# Patient Record
Sex: Female | Born: 1975 | Race: White | Hispanic: No | Marital: Married | Smoking: Never smoker
Health system: Southern US, Community
[De-identification: ages and names within clinical notes are randomized; demographics above are authoritative.]

## PROBLEM LIST (undated history)

## (undated) DIAGNOSIS — F329 Major depressive disorder, single episode, unspecified: Secondary | ICD-10-CM

## (undated) DIAGNOSIS — F32A Depression, unspecified: Secondary | ICD-10-CM

## (undated) DIAGNOSIS — F419 Anxiety disorder, unspecified: Secondary | ICD-10-CM

## (undated) DIAGNOSIS — R0681 Apnea, not elsewhere classified: Secondary | ICD-10-CM

## (undated) HISTORY — DX: Depression, unspecified: F32.A

## (undated) HISTORY — DX: Major depressive disorder, single episode, unspecified: F32.9

## (undated) HISTORY — PX: WISDOM TOOTH EXTRACTION: SHX21

## (undated) HISTORY — PX: CHOLECYSTECTOMY: SHX55

## (undated) HISTORY — DX: Anxiety disorder, unspecified: F41.9

## (undated) HISTORY — DX: Apnea, not elsewhere classified: R06.81

---

## 2006-11-01 ENCOUNTER — Emergency Department: Payer: Self-pay | Admitting: Emergency Medicine

## 2006-11-01 ENCOUNTER — Other Ambulatory Visit: Payer: Self-pay

## 2012-03-22 ENCOUNTER — Ambulatory Visit: Payer: Self-pay | Admitting: Obstetrics and Gynecology

## 2013-01-17 ENCOUNTER — Ambulatory Visit: Payer: Self-pay | Admitting: Otolaryngology

## 2013-01-23 ENCOUNTER — Ambulatory Visit: Payer: Self-pay | Admitting: Otolaryngology

## 2013-01-29 ENCOUNTER — Ambulatory Visit: Payer: Self-pay | Admitting: Emergency Medicine

## 2013-10-02 DIAGNOSIS — E669 Obesity, unspecified: Secondary | ICD-10-CM | POA: Insufficient documentation

## 2016-10-03 DIAGNOSIS — F325 Major depressive disorder, single episode, in full remission: Secondary | ICD-10-CM | POA: Insufficient documentation

## 2018-02-13 DIAGNOSIS — E559 Vitamin D deficiency, unspecified: Secondary | ICD-10-CM | POA: Insufficient documentation

## 2018-07-25 ENCOUNTER — Ambulatory Visit: Payer: BC Managed Care – PPO | Attending: Otolaryngology

## 2018-07-25 DIAGNOSIS — G4733 Obstructive sleep apnea (adult) (pediatric): Secondary | ICD-10-CM | POA: Insufficient documentation

## 2019-01-01 ENCOUNTER — Other Ambulatory Visit: Payer: Self-pay

## 2019-01-01 ENCOUNTER — Encounter: Payer: Self-pay | Admitting: Obstetrics and Gynecology

## 2019-01-01 ENCOUNTER — Other Ambulatory Visit (HOSPITAL_COMMUNITY)
Admission: RE | Admit: 2019-01-01 | Discharge: 2019-01-01 | Disposition: A | Discharge status: Home / Self Care | Payer: BC Managed Care – PPO | Source: Ambulatory Visit | Attending: Obstetrics and Gynecology | Admitting: Obstetrics and Gynecology

## 2019-01-01 ENCOUNTER — Ambulatory Visit (INDEPENDENT_AMBULATORY_CARE_PROVIDER_SITE_OTHER): Payer: BC Managed Care – PPO | Admitting: Obstetrics and Gynecology

## 2019-01-01 VITALS — BP 140/80 | Ht 69.0 in | Wt 214.0 lb

## 2019-01-01 DIAGNOSIS — Z124 Encounter for screening for malignant neoplasm of cervix: Secondary | ICD-10-CM | POA: Diagnosis present

## 2019-01-01 DIAGNOSIS — Z1231 Encounter for screening mammogram for malignant neoplasm of breast: Secondary | ICD-10-CM

## 2019-01-01 DIAGNOSIS — Z01419 Encounter for gynecological examination (general) (routine) without abnormal findings: Secondary | ICD-10-CM

## 2019-01-01 DIAGNOSIS — Z0000 Encounter for general adult medical examination without abnormal findings: Secondary | ICD-10-CM

## 2019-01-01 DIAGNOSIS — B354 Tinea corporis: Secondary | ICD-10-CM

## 2019-01-01 MED ORDER — ITRACONAZOLE 100 MG PO CAPS: 200.0000 mg | ORAL_CAPSULE | Freq: Every day | ORAL | 1 refills | Status: AC

## 2019-01-01 NOTE — Progress Notes (Signed)
Gynecology Annual Exam  PCP: Patient, [name]  Chief Complaint:  Chief Complaint  Patient presents with  . Gynecologic Exam    Night sweats and hot flashes     History of Present Illness: Patient is a 43 y.o. [id number] presents for annual exam. The patient has no complaints today.   LMP: Patient's last menstrual period was 12/23/2018 (exact date). Average Interval: regular, 30 days Duration of flow: 2 days Heavy Menses: no Clots: no Intermenstrual Bleeding: no Postcoital Bleeding: no Dysmenorrhea: no   The patient is sexually active. She currently uses vasectomy for contraception. She denies dyspareunia.  The patient does perform self breast exams.  There is no notable family history of breast or ovarian cancer in her family.  The patient wears seatbelts: yes.   The patient has regular exercise: no.    The patient denies current symptoms of depression.    Review of Systems: Review of Systems  Constitutional: Negative for chills, fever, malaise/fatigue and weight loss.  HENT: Negative for congestion, hearing loss and sinus pain.   Eyes: Negative for blurred vision and double vision.  Respiratory: Negative for cough, sputum production, shortness of breath and wheezing.   Cardiovascular: Negative for chest pain, palpitations, orthopnea and leg swelling.  Gastrointestinal: Negative for abdominal pain, constipation, diarrhea, nausea and vomiting.  Genitourinary: Negative for dysuria, flank pain, frequency, hematuria and urgency.  Musculoskeletal: Negative for back pain, falls and joint pain.  Skin: Negative for itching and rash.  Neurological: Negative for dizziness and headaches.  Psychiatric/Behavioral: Negative for depression, substance abuse and suicidal ideas. The patient is not nervous/anxious.     Past Medical History:  Past Medical History:  Diagnosis Date  . Anxiety   . Apnea   . Depression     Past Surgical History:  Past Surgical History:  Procedure  Laterality Date  . CHOLECYSTECTOMY    . WISDOM TOOTH EXTRACTION     X4 extracted     Gynecologic History:  Patient's last menstrual period was 12/23/2018 (exact date). Contraception: vasectomy Last Pap: Results were: unknown  Last mammogram: 2013 Results were: BI-RAD I  Obstetric History: [id number]  Family History:  Family History  Problem Relation Age of Onset  . Diabetes Father   . Diabetes Maternal Grandmother     Social History:  Social History   Socioeconomic History  . Marital status: Married    Spouse name: Not on file  . Number of children: Not on file  . Years of education: Not on file  . Highest education level: Not on file  Occupational History  . Not on file  Social Needs  . Financial resource strain: Not on file  . Food insecurity:    Worry: Not on file    Inability: Not on file  . Transportation needs:    Medical: Not on file    Non-medical: Not on file  Tobacco Use  . Smoking status: Never Smoker  . Smokeless tobacco: Never Used  Substance and Sexual Activity  . Alcohol use: Yes    Comment: occasional   . Drug use: Never  . Sexual activity: Yes    Birth control/protection: Surgical    Comment: Vasectomy   Lifestyle  . Physical activity:    Days per week: Not on file    Minutes per session: Not on file  . Stress: Not on file  Relationships  . Social connections:    Talks on phone: Not on file    Gets together: Not  on file    Attends religious service: Not on file    Active member of club or organization: Not on file    Attends meetings of clubs or organizations: Not on file    Relationship status: Not on file  . Intimate partner violence:    Fear of current or ex partner: Not on file    Emotionally abused: Not on file    Physically abused: Not on file    Forced sexual activity: Not on file  Other Topics Concern  . Not on file  Social History Narrative  . Not on file    Allergies:  No Known Allergies  Medications: Prior to  Admission medications   Medication Sig Start Date End Date Taking? Authorizing Provider  Cholecalciferol (VITAMIN D3) 125 MCG (5000 UT) TABS Take by mouth. 02/13/18  Yes [provider]  escitalopram (LEXAPRO) 20 MG tablet Take 20 mg by mouth daily. 11/14/18  Yes [provider]  fluconazole (DIFLUCAN) 100 MG tablet TAKE 1 TABLET (100 MG TOTAL) BY MOUTH ONCE DAILY FOR 14 DAYS 10/18/18  Yes [provider]  phentermine 30 MG capsule TAKE 1 CAPSULE (30 MG TOTAL) BY MOUTH EVERY MORNING BEFORE BREAKFAST 11/29/18  Yes [provider]  traZODone (DESYREL) 50 MG tablet Take by mouth. 10/18/18 10/18/19 Yes [provider]    Physical Exam Vitals: Blood pressure 140/80, height 5\' 9"  (1.753 m), weight 214 lb (97.1 kg), last menstrual period 12/23/2018.  General: NAD HEENT: normocephalic, anicteric Thyroid: no enlargement, no palpable nodules Pulmonary: No increased work of breathing, CTAB Cardiovascular: RRR, distal pulses 2+ Breast: Breast symmetrical, no tenderness, no palpable nodules or masses, no skin or nipple retraction present, no nipple discharge.  No axillary or supraclavicular lymphadenopathy. Axillary fungal skin rash noted in left axilla. Abdomen: NABS, soft, non-tender, non-distended.  Umbilicus without lesions.  No hepatomegaly, splenomegaly or masses palpable. No evidence of hernia  Genitourinary:  External: Normal external female genitalia.  Normal urethral meatus, normal Bartholin's and Skene's glands.    Vagina: Normal vaginal mucosa, no evidence of prolapse.    Cervix: Grossly normal in appearance, no bleeding  Uterus: Non-enlarged, mobile, normal contour.  No CMT  Adnexa: ovaries non-enlarged, no adnexal masses  Rectal: deferred  Lymphatic: no evidence of inguinal lymphadenopathy Extremities: no edema, erythema, or tenderness Neurologic: Grossly intact Psychiatric: mood appropriate, affect full  Female chaperone present for pelvic and  breast  portions of the physical exam    Assessment: 43 y.o. G2P1102 routine annual exam  Plan: Problem List Items Addressed This Visit    None    Visit Diagnoses    Screening for cervical cancer    -  Primary   Relevant Orders   Cytology - PAP   Breast cancer screening by mammogram       Relevant Orders   MM DIGITAL SCREENING BILATERAL   Tinea corporis       Relevant Medications   fluconazole (DIFLUCAN) 100 MG tablet   itraconazole (SPORANOX) 100 MG capsule   Healthcare maintenance          1) Mammogram - recommend yearly screening mammogram.  Mammogram Was ordered today   2) STI screening  wasoffered and declined  3) ASCCP guidelines and rational discussed.  Patient opts for every 3 years screening interval  4) Contraception - the patient is currently using  vasectomy.  She is happy with her current form of contraception and plans to continue  5) Colonoscopy -- Screening recommended starting at  age 80 for average risk individuals, age 36 for individuals deemed at increased risk (including African Americans) and recommended to continue until age 27.  For patient age 33-85 individualized approach is recommended.  Gold standard screening is via colonoscopy, Cologuard screening is an acceptable alternative for patient unwilling or unable to undergo colonoscopy.  "Colorectal cancer screening for average?risk adults: 2018 guideline update from the [place]: A Cancer Journal for Clinicians: Jan 26, 2017   6) Routine healthcare maintenance including cholesterol, diabetes screening discussed managed by PCP  7) Return in about 1 year (around 01/01/2020) for annual.  Tinea in left armpit- sent rx for oral antifungal. Has reoccurred after prior oral medication. HTN- blood pressure elevated today. She reports she had one similar instance recently and spoke to her PCP about it. Has never started medication. Advised to follow up with PCP again so medication can be started.  Discussed lifestyle changes of diet and exercise.   Adrian Prows MD [facility] 01/01/2019 5:34 PM

## 2019-01-05 LAB — CYTOLOGY - PAP
Diagnosis: NEGATIVE
HPV: NOT DETECTED

## 2019-01-05 NOTE — Progress Notes (Signed)
Please call patient with normal result.

## 2019-01-08 NOTE — Progress Notes (Signed)
Pt aware.

## 2019-10-27 ENCOUNTER — Ambulatory Visit: Payer: BC Managed Care – PPO | Attending: Internal Medicine

## 2020-01-08 ENCOUNTER — Ambulatory Visit (INDEPENDENT_AMBULATORY_CARE_PROVIDER_SITE_OTHER): Payer: BC Managed Care – PPO | Admitting: Obstetrics and Gynecology

## 2020-01-08 ENCOUNTER — Encounter: Payer: Self-pay | Admitting: Obstetrics and Gynecology

## 2020-01-08 ENCOUNTER — Other Ambulatory Visit: Payer: Self-pay

## 2020-01-08 VITALS — BP 128/80 | Ht 69.0 in | Wt 220.0 lb

## 2020-01-08 DIAGNOSIS — B354 Tinea corporis: Secondary | ICD-10-CM

## 2020-01-08 DIAGNOSIS — Z1231 Encounter for screening mammogram for malignant neoplasm of breast: Secondary | ICD-10-CM

## 2020-01-08 DIAGNOSIS — F320 Major depressive disorder, single episode, mild: Secondary | ICD-10-CM

## 2020-01-08 DIAGNOSIS — Z1329 Encounter for screening for other suspected endocrine disorder: Secondary | ICD-10-CM

## 2020-01-08 DIAGNOSIS — Z13220 Encounter for screening for lipoid disorders: Secondary | ICD-10-CM

## 2020-01-08 DIAGNOSIS — Z0000 Encounter for general adult medical examination without abnormal findings: Secondary | ICD-10-CM | POA: Diagnosis not present

## 2020-01-08 DIAGNOSIS — Z124 Encounter for screening for malignant neoplasm of cervix: Secondary | ICD-10-CM

## 2020-01-08 DIAGNOSIS — E559 Vitamin D deficiency, unspecified: Secondary | ICD-10-CM

## 2020-01-08 DIAGNOSIS — Z131 Encounter for screening for diabetes mellitus: Secondary | ICD-10-CM

## 2020-01-08 DIAGNOSIS — Z01419 Encounter for gynecological examination (general) (routine) without abnormal findings: Secondary | ICD-10-CM | POA: Diagnosis not present

## 2020-01-08 DIAGNOSIS — Z130 Encounter for screening for diseases of the blood and blood-forming organs and certain disorders involving the immune mechanism: Secondary | ICD-10-CM

## 2020-01-08 MED ORDER — ESCITALOPRAM OXALATE 20 MG PO TABS: 20.0000 mg | ORAL_TABLET | Freq: Every day | ORAL | 12 refills | Status: DC

## 2020-01-08 MED ORDER — TERBINAFINE HCL 250 MG PO TABS: 250.0000 mg | ORAL_TABLET | Freq: Every day | ORAL | 0 refills | Status: DC

## 2020-01-08 NOTE — Patient Instructions (Signed)
Institute of Medicine Recommended Dietary Allowances for Calcium and Vitamin D  Age (yr) Calcium Recommended Dietary Allowance (mg/day) Vitamin D Recommended Dietary Allowance (international units/day)  9-18 1,300 600  19-50 1,000 600  51-70 1,200 600  71 and older 1,200 800  Data from Institute of Medicine. Dietary reference intakes: calcium, vitamin D. [place], [place]: National Academies Press; 2011.     Exercising to Stay Healthy To become healthy and stay healthy, it is recommended that you do moderate-intensity and vigorous-intensity exercise. You can tell that you are exercising at a moderate intensity if your heart starts beating faster and you start breathing faster but can still hold a conversation. You can tell that you are exercising at a vigorous intensity if you are breathing much harder and faster and cannot hold a conversation while exercising. Exercising regularly is important. It has many health benefits, such as:  Improving overall fitness, flexibility, and endurance.  Increasing bone density.  Helping with weight control.  Decreasing body fat.  Increasing muscle strength.  Reducing stress and tension.  Improving overall health. How often should I exercise? Choose an activity that you enjoy, and set realistic goals. Your health care provider can help you make an activity plan that works for you. Exercise regularly as told by your health care provider. This may include:  Doing strength training two times a week, such as: ? Lifting weights. ? Using resistance bands. ? Push-ups. ? Sit-ups. ? Yoga.  Doing a certain intensity of exercise for a given amount of time. Choose from these options: ? A total of 150 minutes of moderate-intensity exercise every week. ? A total of 75 minutes of vigorous-intensity exercise every week. ? A mix of moderate-intensity and vigorous-intensity exercise every week. Children, pregnant women, people who have not exercised  regularly, people who are overweight, and older adults may need to talk with a health care provider about what activities are safe to do. If you have a medical condition, be sure to talk with your health care provider before you start a new exercise program. What are some exercise ideas? Moderate-intensity exercise ideas include:  Walking 1 mile (1.6 km) in about 15 minutes.  Biking.  Hiking.  Golfing.  Dancing.  Water aerobics. Vigorous-intensity exercise ideas include:  Walking 4.5 miles (7.2 km) or more in about 1 hour.  Jogging or running 5 miles (8 km) in about 1 hour.  Biking 10 miles (16.1 km) or more in about 1 hour.  Lap swimming.  Roller-skating or in-line skating.  Cross-country skiing.  Vigorous competitive sports, such as football, basketball, and soccer.  Jumping rope.  Aerobic dancing. What are some everyday activities that can help me to get exercise?  Yard work, such as: ? Pushing a lawn mower. ? Raking and bagging leaves.  Washing your car.  Pushing a stroller.  Shoveling snow.  Gardening.  Washing windows or floors. How can I be more active in my day-to-day activities?  Use stairs instead of an elevator.  Take a walk during your lunch break.  If you drive, park your car farther away from your work or school.  If you take public transportation, get off one stop early and walk the rest of the way.  Stand up or walk around during all of your indoor phone calls.  Get up, stretch, and walk around every 30 minutes throughout the day.  Enjoy exercise with a friend. Support to continue exercising will help you keep a regular routine of activity. What guidelines can   I follow while exercising?  Before you start a new exercise program, talk with your health care provider.  Do not exercise so much that you hurt yourself, feel dizzy, or get very short of breath.  Wear comfortable clothes and wear shoes with good support.  Drink plenty of  water while you exercise to prevent dehydration or heat stroke.  Work out until your breathing and your heartbeat get faster. Where to find more information  U.S. Department of Health and Human Services: www.hhs.gov  Centers for Disease Control and Prevention (CDC): www.cdc.gov Summary  Exercising regularly is important. It will improve your overall fitness, flexibility, and endurance.  Regular exercise also will improve your overall health. It can help you control your weight, reduce stress, and improve your bone density.  Do not exercise so much that you hurt yourself, feel dizzy, or get very short of breath.  Before you start a new exercise program, talk with your health care provider. This information is not intended to replace advice given to you by your health care provider. Make sure you discuss any questions you have with your health care provider. Document Revised: 07/29/2017 Document Reviewed: 07/07/2017 Elsevier Patient Education  2020 Elsevier Inc.   Budget-Friendly Healthy Eating There are many ways to save money at the grocery store and continue to eat healthy. You can be successful if you:  Plan meals according to your budget.  Make a grocery list and only purchase food according to your grocery list.  Prepare food yourself. What are tips for following this plan?  Reading food labels  Compare food labels between brand name foods and the store brand. Often the nutritional value is the same, but the store brand is lower cost.  Look for products that do not have added sugar, fat, or salt (sodium). These often cost the same but are healthier for you. Products may be labeled as: ? Sugar-free. ? Nonfat. ? Low-fat. ? Sodium-free. ? Low-sodium.  Look for lean ground beef labeled as at least 92% lean and 8% fat. Shopping  Buy only the items on your grocery list and go only to the areas of the store that have the items on your list.  Use coupons only for foods  and brands you normally buy. Avoid buying items you wouldn't normally buy simply because they are on sale.  Check online and in newspapers for weekly deals.  Buy healthy items from the bulk bins when available, such as herbs, spices, flour, pasta, nuts, and dried fruit.  Buy fruits and vegetables that are in season. Prices are usually lower on in-season produce.  Look at the unit price on the price tag. Use it to compare different brands and sizes to find out which item is the best deal.  Choose healthy items that are often low-cost, such as carrots, potatoes, apples, bananas, and oranges. Dried or canned beans are a low-cost protein source.  Buy in bulk and freeze extra food. Items you can buy in bulk include meats, fish, poultry, frozen fruits, and frozen vegetables.  Avoid buying "ready-to-eat" foods, such as pre-cut fruits and vegetables and pre-made salads.  If possible, shop around to discover where you can find the best prices. Consider other retailers such as dollar stores, larger wholesale stores, local fruit and vegetable stands, and farmers markets.  Do not shop when you are hungry. If you shop while hungry, it may be hard to stick to your list and budget.  Resist impulse buying. Use your grocery list as   your official plan for the week.  Buy a variety of vegetables and fruits by purchasing fresh, frozen, and canned items.  Look at the top and bottom shelves for deals. Foods at eye level (eye level of an adult or child) are usually more expensive.  Be efficient with your time when shopping. The more time you spend at the store, the more money you are likely to spend.  To save money when choosing more expensive foods like meats and dairy: ? Choose cheaper cuts of meat, such as bone-in chicken thighs and drumsticks instead of skinless and boneless chicken. When you are ready to prepare the chicken, you can remove the skin yourself to make it healthier. ? Choose lean meats like  chicken or [place] instead of beef. ? Choose canned seafood, such as tuna, salmon, or sardines. ? Buy eggs as a low-cost source of protein. ? Buy dried beans and peas, such as lentils, split peas, or kidney beans instead of meats. Dried beans and peas are a good alternative source of protein. ? Buy the larger tubs of yogurt instead of individual-sized containers.  Choose water instead of sodas and other sweetened beverages.  Avoid buying chips, cookies, and other "junk food." These items are usually expensive and not healthy. Cooking  Make extra food and freeze the extras in meal-sized containers or in individual portions for fast meals and snacks.  Pre-cook on days when you have extra time to prepare meals in advance. You can keep these meals in the fridge or freezer and reheat for a quick meal.  When you come home from the grocery store, wash, peel, and cut fruits and vegetables so they are ready to use and eat. This will help reduce food waste. Meal planning  Do not eat out or get fast food. Prepare food at home.  Make a grocery list and make sure to bring it with you to the store. If you have a smart phone, you could use your phone to create your shopping list.  Plan meals and snacks according to a grocery list and budget you create.  Use leftovers in your meal plan for the week.  Look for recipes where you can cook once and make enough food for two meals.  Include budget-friendly meals like stews, casseroles, and stir-fry dishes.  Try some meatless meals or try "no cook" meals like salads.  Make sure that half your plate is filled with fruits or vegetables. Choose from fresh, frozen, or canned fruits and vegetables. If eating canned, remember to rinse them before eating. This will remove any excess salt added for packaging. Summary  Eating healthy on a budget is possible if you plan your meals according to your budget, purchase according to your budget and grocery list, and  prepare food yourself.  Tips for buying more food on a limited budget include buying generic brands, using coupons only for foods you normally buy, and buying healthy items from the bulk bins when available.  Tips for buying cheaper food to replace expensive food include choosing cheaper, lean cuts of meat, and buying dried beans and peas. This information is not intended to replace advice given to you by your health care provider. Make sure you discuss any questions you have with your health care provider. Document Revised: 08/17/2017 Document Reviewed: 08/17/2017 Elsevier Patient Education  [place].   Budget-Friendly Healthy Eating There are many ways to save money at the grocery store and continue to eat healthy. You can be successful  if you:  Plan meals according to your budget.  Make a grocery list and only purchase food according to your grocery list.  Prepare food yourself. What are tips for following this plan?  Reading food labels  Compare food labels between brand name foods and the store brand. Often the nutritional value is the same, but the store brand is lower cost.  Look for products that do not have added sugar, fat, or salt (sodium). These often cost the same but are healthier for you. Products may be labeled as: ? Sugar-free. ? Nonfat. ? Low-fat. ? Sodium-free. ? Low-sodium.  Look for lean ground beef labeled as at least 92% lean and 8% fat. Shopping  Buy only the items on your grocery list and go only to the areas of the store that have the items on your list.  Use coupons only for foods and brands you normally buy. Avoid buying items you wouldn't normally buy simply because they are on sale.  Check online and in newspapers for weekly deals.  Buy healthy items from the bulk bins when available, such as herbs, spices, flour, pasta, nuts, and dried fruit.  Buy fruits and vegetables that are in season. Prices are usually lower on in-season  produce.  Look at the unit price on the price tag. Use it to compare different brands and sizes to find out which item is the best deal.  Choose healthy items that are often low-cost, such as carrots, potatoes, apples, bananas, and oranges. Dried or canned beans are a low-cost protein source.  Buy in bulk and freeze extra food. Items you can buy in bulk include meats, fish, poultry, frozen fruits, and frozen vegetables.  Avoid buying "ready-to-eat" foods, such as pre-cut fruits and vegetables and pre-made salads.  If possible, shop around to discover where you can find the best prices. Consider other retailers such as dollar stores, larger [employer], local fruit and vegetable stands, and farmers markets.  Do not shop when you are hungry. If you shop while hungry, it may be hard to stick to your list and budget.  Resist impulse buying. Use your grocery list as your official plan for the week.  Buy a variety of vegetables and fruits by purchasing fresh, frozen, and canned items.  Look at the top and bottom shelves for deals. Foods at eye level (eye level of an adult or child) are usually more expensive.  Be efficient with your time when shopping. The more time you spend at the store, the more money you are likely to spend.  To save money when choosing more expensive foods like meats and dairy: ? Choose cheaper cuts of meat, such as bone-in chicken thighs and drumsticks instead of skinless and boneless chicken. When you are ready to prepare the chicken, you can remove the skin yourself to make it healthier. ? Choose lean meats like chicken or [place] instead of beef. ? Choose canned seafood, such as tuna, salmon, or sardines. ? Buy eggs as a low-cost source of protein. ? Buy dried beans and peas, such as lentils, split peas, or kidney beans instead of meats. Dried beans and peas are a good alternative source of protein. ? Buy the larger tubs of yogurt instead of individual-sized  containers.  Choose water instead of sodas and other sweetened beverages.  Avoid buying chips, cookies, and other "junk food." These items are usually expensive and not healthy. Cooking  Make extra food and freeze the extras in meal-sized containers or in individual  portions for fast meals and snacks.  Pre-cook on days when you have extra time to prepare meals in advance. You can keep these meals in the fridge or freezer and reheat for a quick meal.  When you come home from the grocery store, wash, peel, and cut fruits and vegetables so they are ready to use and eat. This will help reduce food waste. Meal planning  Do not eat out or get fast food. Prepare food at home.  Make a grocery list and make sure to bring it with you to the store. If you have a smart phone, you could use your phone to create your shopping list.  Plan meals and snacks according to a grocery list and budget you create.  Use leftovers in your meal plan for the week.  Look for recipes where you can cook once and make enough food for two meals.  Include budget-friendly meals like stews, casseroles, and stir-fry dishes.  Try some meatless meals or try "no cook" meals like salads.  Make sure that half your plate is filled with fruits or vegetables. Choose from fresh, frozen, or canned fruits and vegetables. If eating canned, remember to rinse them before eating. This will remove any excess salt added for packaging. Summary  Eating healthy on a budget is possible if you plan your meals according to your budget, purchase according to your budget and grocery list, and prepare food yourself.  Tips for buying more food on a limited budget include buying generic brands, using coupons only for foods you normally buy, and buying healthy items from the bulk bins when available.  Tips for buying cheaper food to replace expensive food include choosing cheaper, lean cuts of meat, and buying dried beans and peas. This  information is not intended to replace advice given to you by your health care provider. Make sure you discuss any questions you have with your health care provider. Document Revised: 08/17/2017 Document Reviewed: 08/17/2017 Elsevier Patient Education  [place].

## 2020-01-08 NOTE — Progress Notes (Signed)
Gynecology Annual Exam  PCP: Patient, [name]  Chief Complaint:  Chief Complaint  Patient presents with  . Gynecologic Exam    History of Present Illness: Patient is a 44 y.o. [id number] presents for annual exam. The patient has no complaints today.   LMP: Patient's last menstrual period was 12/28/2019. Average Interval: regular, monthly Duration of flow: 2-5 days Heavy Menses: no Clots: no Intermenstrual Bleeding: no Postcoital Bleeding: no Dysmenorrhea: no   The patient is sexually active. She currently uses vasectomy for contraception. She denies dyspareunia.  The patient does perform self breast exams.  There is no notable family history of breast or ovarian cancer in her family.   The patient has regular exercise: walks.    The patient denies current symptoms of depression.    Review of Systems: ROS  Past Medical History:  Patient Active Problem List   Diagnosis Date Noted  . Vitamin D deficiency 02/13/2018  . 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 Obesity 10/02/2013    Past Surgical History:  Past Surgical History:  Procedure Laterality Date  . CHOLECYSTECTOMY    . WISDOM TOOTH EXTRACTION     X4 extracted     Gynecologic History:  Patient's last menstrual period was 12/28/2019. Contraception: vasectomy Last Pap: Results were: 01/01/2019 NIL and HR HPV negative  Last mammogram: at 30,  Results were: BI-RAD I  Obstetric History: [id number]  Family History:  Family History  Problem Relation Age of Onset  . Diabetes Father   . Diabetes Maternal Grandmother     Social History:  Social History   Socioeconomic History  . Marital status: Married    Spouse name: Not on file  . Number of children: Not on file  . Years of education: Not on file  . Highest education level: Not on file  Occupational History  . Not on file  Tobacco Use  . Smoking status: Never Smoker  . Smokeless tobacco: Never Used  Substance and Sexual Activity  .  Alcohol use: Yes    Comment: occasional   . Drug use: Never  . Sexual activity: Yes    Birth control/protection: Surgical    Comment: Vasectomy   Other Topics Concern  . Not on file  Social History Narrative  . Not on file   Social Determinants of Health   Financial Resource Strain:   . Difficulty of Paying Living Expenses:   Food Insecurity:   . Worried About Charity fundraiser in the Last Year:   . Arboriculturist in the Last Year:   Transportation Needs:   . Film/video editor (Medical):   Marland Kitchen Lack of Transportation (Non-Medical):   Physical Activity:   . Days of Exercise per Week:   . Minutes of Exercise per Session:   Stress:   . Feeling of Stress :   Social Connections:   . Frequency of Communication with Friends and Family:   . Frequency of Social Gatherings with Friends and Family:   . Attends Religious Services:   . Active Member of Clubs or Organizations:   . Attends Archivist Meetings:   Marland Kitchen Marital Status:   Intimate Partner Violence:   . Fear of Current or Ex-Partner:   . Emotionally Abused:   Marland Kitchen Physically Abused:   . Sexually Abused:     Allergies:  No Known Allergies  Medications: Prior to Admission medications   Medication Sig Start Date End Date Taking? Authorizing Provider  Cholecalciferol (  VITAMIN D3) 125 MCG (5000 UT) TABS Take by mouth. 02/13/18  Yes [provider]  escitalopram (LEXAPRO) 20 MG tablet Take 20 mg by mouth daily. 11/14/18  Yes [provider]  Multiple Vitamin (MULTIVITAMIN) tablet Take 1 tablet by mouth daily.   Yes [provider]    Physical Exam Vitals: Blood pressure 128/80, height 5\' 9"  (1.753 m), weight 220 lb (99.8 kg), last menstrual period 12/28/2019.  General: NAD HEENT: normocephalic, anicteric Thyroid: no enlargement, no palpable nodules Pulmonary: No increased work of breathing, CTAB Cardiovascular: RRR, distal pulses 2+ Breast: Breast symmetrical, no tenderness, no  palpable nodules or masses, no skin or nipple retraction present, no nipple discharge.  No axillary or supraclavicular lymphadenopathy. Abdomen: NABS, soft, non-tender, non-distended.  Umbilicus without lesions.  No hepatomegaly, splenomegaly or masses palpable. No evidence of hernia  Genitourinary:  External: Normal external female genitalia.  Normal urethral meatus, normal Bartholin's and Skene's glands.    Vagina: Normal vaginal mucosa, no evidence of prolapse.    Cervix: Grossly normal in appearance, no bleeding  Uterus: Non-enlarged, mobile, normal contour.  No CMT  Adnexa: ovaries non-enlarged, no adnexal masses  Rectal: deferred  Lymphatic: no evidence of inguinal lymphadenopathy Extremities: no edema, erythema, or tenderness Neurologic: Grossly intact Psychiatric: mood appropriate, affect full  Female chaperone present for pelvic and breast  portions of the physical exam    Assessment: 44 y.o. G2P1102 routine annual exam  Plan: Problem List Items Addressed This Visit      Other   Vitamin D deficiency   Relevant Orders   Vitamin D 1,25 dihydroxy    Other Visit Diagnoses    Encounter for annual routine gynecological examination    -  Primary   Relevant Orders   CBC With Differential   Comprehensive metabolic panel   Lipid panel   TSH   Vitamin D 1,25 dihydroxy   Health maintenance examination       Relevant Orders   CBC With Differential   Comprehensive metabolic panel   Lipid panel   TSH   Vitamin D 1,25 dihydroxy   Screening for cervical cancer       Breast cancer screening by mammogram       Screening for thyroid disorder       Relevant Orders   TSH   Screening for deficiency anemia       Relevant Orders   CBC With Differential   Screening for diabetes mellitus       Relevant Orders   Hemoglobin A1c   Screening cholesterol level       Relevant Orders   Lipid panel   Current mild episode of major depressive disorder without prior episode ([facility])        Relevant Medications   escitalopram (LEXAPRO) 20 MG tablet   Tinea corporis       Relevant Medications   terbinafine (LAMISIL) 250 MG tablet   Encounter for screening mammogram for malignant neoplasm of breast       Relevant Orders   MM 3D SCREEN BREAST BILATERAL      1) Mammogram - recommend yearly screening mammogram.  Mammogram Was ordered today   2) STI screening  was declined.  3) ASCCP guidelines and rational discussed.  Patient opts for every 5 years screening interval  4) Contraception - the patient is currently using  vasectomy.  She is happy with her current form of contraception and plans to continue  5) Colonoscopy -- Screening recommended starting at age  55 for average risk individuals, age 57 for individuals deemed at increased risk (including African Americans) and recommended to continue until age 64.  For patient age 51-85 individualized approach is recommended.  Gold standard screening is via colonoscopy, Cologuard screening is an acceptable alternative for patient unwilling or unable to undergo colonoscopy.  "Colorectal cancer screening for average?risk adults: 2018 guideline update from the American Cancer Society"[place]: A Cancer Journal for Clinicians: Jan 26, 2017   6) Routine healthcare maintenance including cholesterol, diabetes screening discussed To return fasting at a later date  7) Tinea corporis- rx for oral antifungal sent.   8)Depression stable with Lexapro. PHQ-9: 5 GAD-7: 1  Refill for medication sent.    Return in about 1 year (around 01/07/2021) for Annual, fasting lab appointment ASAP.   [facility] 01/08/2020, 4:16 PM

## 2020-01-14 ENCOUNTER — Other Ambulatory Visit: Payer: BC Managed Care – PPO

## 2020-01-14 ENCOUNTER — Other Ambulatory Visit: Payer: Self-pay

## 2020-01-14 DIAGNOSIS — Z0000 Encounter for general adult medical examination without abnormal findings: Secondary | ICD-10-CM

## 2020-01-14 DIAGNOSIS — Z1329 Encounter for screening for other suspected endocrine disorder: Secondary | ICD-10-CM

## 2020-01-14 DIAGNOSIS — Z01419 Encounter for gynecological examination (general) (routine) without abnormal findings: Secondary | ICD-10-CM

## 2020-01-14 DIAGNOSIS — Z130 Encounter for screening for diseases of the blood and blood-forming organs and certain disorders involving the immune mechanism: Secondary | ICD-10-CM

## 2020-01-14 DIAGNOSIS — E559 Vitamin D deficiency, unspecified: Secondary | ICD-10-CM

## 2020-01-14 DIAGNOSIS — Z13220 Encounter for screening for lipoid disorders: Secondary | ICD-10-CM

## 2020-01-14 DIAGNOSIS — Z131 Encounter for screening for diabetes mellitus: Secondary | ICD-10-CM

## 2020-01-18 LAB — CBC WITH DIFFERENTIAL
Basophils Absolute: 0 10*3/uL (ref 0.0–0.2)
Basos: 1 %
EOS (ABSOLUTE): 0.3 10*3/uL (ref 0.0–0.4)
Eos: 5 %
Hematocrit: 34.9 % (ref 34.0–46.6)
Hemoglobin: 11.8 g/dL (ref 11.1–15.9)
Immature Grans (Abs): 0 10*3/uL (ref 0.0–0.1)
Immature Granulocytes: 0 %
Lymphocytes Absolute: 1.8 10*3/uL (ref 0.7–3.1)
Lymphs: 27 %
MCH: 31.3 pg (ref 26.6–33.0)
MCHC: 33.8 g/dL (ref 31.5–35.7)
MCV: 93 fL (ref 79–97)
Monocytes Absolute: 0.4 10*3/uL (ref 0.1–0.9)
Monocytes: 6 %
Neutrophils Absolute: 4.2 10*3/uL (ref 1.4–7.0)
Neutrophils: 61 %
RBC: 3.77 x10E6/uL (ref 3.77–5.28)
RDW: 12 % (ref 11.7–15.4)
WBC: 6.9 10*3/uL (ref 3.4–10.8)

## 2020-01-18 LAB — COMPREHENSIVE METABOLIC PANEL
ALT: 26 IU/L (ref 0–32)
AST: 22 IU/L (ref 0–40)
Albumin/Globulin Ratio: 1.7 (ref 1.2–2.2)
Albumin: 4.5 g/dL (ref 3.8–4.8)
Alkaline Phosphatase: 74 IU/L (ref 48–121)
BUN/Creatinine Ratio: 15 (ref 9–23)
BUN: 12 mg/dL (ref 6–24)
Bilirubin Total: 0.4 mg/dL (ref 0.0–1.2)
CO2: 21 mmol/L (ref 20–29)
Calcium: 9.5 mg/dL (ref 8.7–10.2)
Chloride: 102 mmol/L (ref 96–106)
Creatinine, Ser: 0.78 mg/dL (ref 0.57–1.00)
GFR calc Af Amer: 108 mL/min/{1.73_m2} (ref >59)
GFR calc non Af Amer: 94 mL/min/{1.73_m2} (ref >59)
Globulin, Total: 2.6 g/dL (ref 1.5–4.5)
Glucose: 105 mg/dL — ABNORMAL HIGH (ref 65–99)
Potassium: 4.4 mmol/L (ref 3.5–5.2)
Sodium: 138 mmol/L (ref 134–144)
Total Protein: 7.1 g/dL (ref 6.0–8.5)

## 2020-01-18 LAB — HEMOGLOBIN A1C
Est. average glucose Bld gHb Est-mCnc: 111 mg/dL
Hgb A1c MFr Bld: 5.5 % (ref 4.8–5.6)

## 2020-01-18 LAB — LIPID PANEL
Chol/HDL Ratio: 4 ratio (ref 0.0–4.4)
Cholesterol, Total: 195 mg/dL (ref 100–199)
HDL: 49 mg/dL (ref >39)
LDL Chol Calc (NIH): 119 mg/dL — ABNORMAL HIGH (ref 0–99)
Triglycerides: 154 mg/dL — ABNORMAL HIGH (ref 0–149)
VLDL Cholesterol Cal: 27 mg/dL (ref 5–40)

## 2020-01-18 LAB — VITAMIN D 1,25 DIHYDROXY
Vitamin D 1, 25 (OH)2 Total: 55 pg/mL
Vitamin D2 1, 25 (OH)2: <10 pg/mL
Vitamin D3 1, 25 (OH)2: 55 pg/mL

## 2020-01-18 LAB — TSH: TSH: 1.27 u[IU]/mL (ref 0.450–4.500)

## 2020-03-25 ENCOUNTER — Ambulatory Visit
Admission: RE | Admit: 2020-03-25 | Discharge: 2020-03-25 | Disposition: A | Discharge status: Home / Self Care | Payer: BC Managed Care – PPO | Source: Ambulatory Visit | Attending: Obstetrics and Gynecology | Admitting: Obstetrics and Gynecology

## 2020-03-25 DIAGNOSIS — Z1231 Encounter for screening mammogram for malignant neoplasm of breast: Secondary | ICD-10-CM | POA: Diagnosis present

## 2020-03-26 ENCOUNTER — Other Ambulatory Visit: Payer: Self-pay | Admitting: Obstetrics and Gynecology

## 2020-03-26 DIAGNOSIS — R921 Mammographic calcification found on diagnostic imaging of breast: Secondary | ICD-10-CM

## 2020-03-26 DIAGNOSIS — R928 Other abnormal and inconclusive findings on diagnostic imaging of breast: Secondary | ICD-10-CM

## 2020-04-02 ENCOUNTER — Other Ambulatory Visit: Payer: Self-pay | Admitting: Obstetrics and Gynecology

## 2020-04-02 DIAGNOSIS — R928 Other abnormal and inconclusive findings on diagnostic imaging of breast: Secondary | ICD-10-CM

## 2020-04-02 DIAGNOSIS — R921 Mammographic calcification found on diagnostic imaging of breast: Secondary | ICD-10-CM

## 2020-04-08 ENCOUNTER — Ambulatory Visit
Admission: RE | Admit: 2020-04-08 | Discharge: 2020-04-08 | Disposition: A | Discharge status: Home / Self Care | Payer: BC Managed Care – PPO | Source: Ambulatory Visit | Attending: Obstetrics and Gynecology | Admitting: Obstetrics and Gynecology

## 2020-04-08 ENCOUNTER — Other Ambulatory Visit: Payer: Self-pay

## 2020-04-08 DIAGNOSIS — R928 Other abnormal and inconclusive findings on diagnostic imaging of breast: Secondary | ICD-10-CM

## 2020-04-08 DIAGNOSIS — R921 Mammographic calcification found on diagnostic imaging of breast: Secondary | ICD-10-CM

## 2020-08-02 ENCOUNTER — Other Ambulatory Visit: Payer: Self-pay | Admitting: Obstetrics and Gynecology

## 2020-08-02 DIAGNOSIS — R921 Mammographic calcification found on diagnostic imaging of breast: Secondary | ICD-10-CM

## 2020-09-19 ENCOUNTER — Other Ambulatory Visit: Payer: Self-pay | Admitting: Obstetrics and Gynecology

## 2020-09-19 DIAGNOSIS — F320 Major depressive disorder, single episode, mild: Secondary | ICD-10-CM

## 2020-10-10 ENCOUNTER — Other Ambulatory Visit: Payer: Self-pay

## 2020-10-10 ENCOUNTER — Other Ambulatory Visit: Payer: Self-pay | Admitting: Obstetrics and Gynecology

## 2020-10-10 ENCOUNTER — Ambulatory Visit
Admission: RE | Admit: 2020-10-10 | Discharge: 2020-10-10 | Disposition: A | Discharge status: Home / Self Care | Payer: BC Managed Care – PPO | Source: Ambulatory Visit | Attending: Obstetrics and Gynecology | Admitting: Obstetrics and Gynecology

## 2020-10-10 DIAGNOSIS — R921 Mammographic calcification found on diagnostic imaging of breast: Secondary | ICD-10-CM

## 2020-10-10 DIAGNOSIS — R590 Localized enlarged lymph nodes: Secondary | ICD-10-CM

## 2020-10-24 ENCOUNTER — Ambulatory Visit
Admission: RE | Admit: 2020-10-24 | Discharge: 2020-10-24 | Disposition: A | Discharge status: Home / Self Care | Payer: BC Managed Care – PPO | Source: Ambulatory Visit | Attending: Obstetrics and Gynecology | Admitting: Obstetrics and Gynecology

## 2020-10-24 ENCOUNTER — Other Ambulatory Visit: Payer: Self-pay

## 2020-10-24 DIAGNOSIS — R590 Localized enlarged lymph nodes: Secondary | ICD-10-CM | POA: Insufficient documentation

## 2021-01-09 ENCOUNTER — Encounter: Payer: Self-pay | Admitting: Obstetrics and Gynecology

## 2021-01-09 ENCOUNTER — Ambulatory Visit (INDEPENDENT_AMBULATORY_CARE_PROVIDER_SITE_OTHER): Payer: BC Managed Care – PPO | Admitting: Obstetrics and Gynecology

## 2021-01-09 ENCOUNTER — Other Ambulatory Visit: Payer: Self-pay

## 2021-01-09 VITALS — BP 116/70 | Ht 69.0 in | Wt 206.2 lb

## 2021-01-09 DIAGNOSIS — Z1239 Encounter for other screening for malignant neoplasm of breast: Secondary | ICD-10-CM

## 2021-01-09 DIAGNOSIS — R5383 Other fatigue: Secondary | ICD-10-CM

## 2021-01-09 DIAGNOSIS — Z0000 Encounter for general adult medical examination without abnormal findings: Secondary | ICD-10-CM

## 2021-01-09 DIAGNOSIS — R875 Abnormal microbiological findings in specimens from female genital organs: Secondary | ICD-10-CM

## 2021-01-09 DIAGNOSIS — R921 Mammographic calcification found on diagnostic imaging of breast: Secondary | ICD-10-CM

## 2021-01-09 DIAGNOSIS — Z13220 Encounter for screening for lipoid disorders: Secondary | ICD-10-CM

## 2021-01-09 DIAGNOSIS — Z01419 Encounter for gynecological examination (general) (routine) without abnormal findings: Secondary | ICD-10-CM

## 2021-01-09 DIAGNOSIS — Z1231 Encounter for screening mammogram for malignant neoplasm of breast: Secondary | ICD-10-CM

## 2021-01-09 DIAGNOSIS — Z124 Encounter for screening for malignant neoplasm of cervix: Secondary | ICD-10-CM | POA: Diagnosis not present

## 2021-01-09 DIAGNOSIS — Z1329 Encounter for screening for other suspected endocrine disorder: Secondary | ICD-10-CM

## 2021-01-09 DIAGNOSIS — Z131 Encounter for screening for diabetes mellitus: Secondary | ICD-10-CM

## 2021-01-09 DIAGNOSIS — N761 Subacute and chronic vaginitis: Secondary | ICD-10-CM

## 2021-01-09 NOTE — Patient Instructions (Signed)
Institute of Medicine Recommended Dietary Allowances for Calcium and Vitamin D  Age (yr) Calcium Recommended Dietary Allowance (mg/day) Vitamin D Recommended Dietary Allowance (international units/day)  9-18 1,300 600  19-50 1,000 600  51-70 1,200 600  71 and older 1,200 800  Data from Institute of Medicine. Dietary reference intakes: calcium, vitamin D. [place], [place]: National Academies Press; 2011.    Exercising to Stay Healthy To become healthy and stay healthy, it is recommended that you do moderate-intensity and vigorous-intensity exercise. You can tell that you are exercising at a moderate intensity if your heart starts beating faster and you start breathing faster but can still hold a conversation. You can tell that you are exercising at a vigorous intensity if you are breathing much harder and faster and cannot hold a conversation while exercising. Exercising regularly is important. It has many health benefits, such as:  Improving overall fitness, flexibility, and endurance.  Increasing bone density.  Helping with weight control.  Decreasing body fat.  Increasing muscle strength.  Reducing stress and tension.  Improving overall health. How often should I exercise? Choose an activity that you enjoy, and set realistic goals. Your health care provider can help you make an activity plan that works for you. Exercise regularly as told by your health care provider. This may include:  Doing strength training two times a week, such as: ? Lifting weights. ? Using resistance bands. ? Push-ups. ? Sit-ups. ? Yoga.  Doing a certain intensity of exercise for a given amount of time. Choose from these options: ? A total of 150 minutes of moderate-intensity exercise every week. ? A total of 75 minutes of vigorous-intensity exercise every week. ? A mix of moderate-intensity and vigorous-intensity exercise every week. Children, pregnant women, people who have not exercised  regularly, people who are overweight, and older adults may need to talk with a health care provider about what activities are safe to do. If you have a medical condition, be sure to talk with your health care provider before you start a new exercise program. What are some exercise ideas? Moderate-intensity exercise ideas include:  Walking 1 mile (1.6 km) in about 15 minutes.  Biking.  Hiking.  Golfing.  Dancing.  Water aerobics. Vigorous-intensity exercise ideas include:  Walking 4.5 miles (7.2 km) or more in about 1 hour.  Jogging or running 5 miles (8 km) in about 1 hour.  Biking 10 miles (16.1 km) or more in about 1 hour.  Lap swimming.  Roller-skating or in-line skating.  Cross-country skiing.  Vigorous competitive sports, such as football, basketball, and soccer.  Jumping rope.  Aerobic dancing.   What are some everyday activities that can help me to get exercise?  Yard work, such as: ? Pushing a lawn mower. ? Raking and bagging leaves.  Washing your car.  Pushing a stroller.  Shoveling snow.  Gardening.  Washing windows or floors. How can I be more active in my day-to-day activities?  Use stairs instead of an elevator.  Take a walk during your lunch break.  If you drive, park your car farther away from your work or school.  If you take public transportation, get off one stop early and walk the rest of the way.  Stand up or walk around during all of your indoor phone calls.  Get up, stretch, and walk around every 30 minutes throughout the day.  Enjoy exercise with a friend. Support to continue exercising will help you keep a regular routine of activity. What guidelines   can I follow while exercising?  Before you start a new exercise program, talk with your health care provider.  Do not exercise so much that you hurt yourself, feel dizzy, or get very short of breath.  Wear comfortable clothes and wear shoes with good support.  Drink plenty of  water while you exercise to prevent dehydration or heat stroke.  Work out until your breathing and your heartbeat get faster. Where to find more information  U.S. Department of Health and Human Services: www.hhs.gov  Centers for Disease Control and Prevention (CDC): www.cdc.gov Summary  Exercising regularly is important. It will improve your overall fitness, flexibility, and endurance.  Regular exercise also will improve your overall health. It can help you control your weight, reduce stress, and improve your bone density.  Do not exercise so much that you hurt yourself, feel dizzy, or get very short of breath.  Before you start a new exercise program, talk with your health care provider. This information is not intended to replace advice given to you by your health care provider. Make sure you discuss any questions you have with your health care provider. Document Revised: 07/29/2017 Document Reviewed: 07/07/2017 Elsevier Patient Education  2021 Elsevier Inc.   Budget-Friendly Healthy Eating There are many ways to save money at the grocery store and continue to eat healthy. You can be successful if you:  Plan meals according to your budget.  Make a grocery list and only purchase food according to your grocery list.  Prepare food yourself at home. What are tips for following this plan? Reading food labels  Compare food labels between brand name foods and the store brand. Often the nutritional value is the same, but the store brand is lower cost.  Look for products that do not have added sugar, fat, or salt (sodium). These often cost the same but are healthier for you. Products may be labeled as: ? Sugar-free. ? Nonfat. ? Low-fat. ? Sodium-free. ? Low-sodium.  Look for lean ground beef labeled as at least 92% lean and 8% fat. Shopping  Buy only the items on your grocery list and go only to the areas of the store that have the items on your list.  Use coupons only for  foods and brands you normally buy. Avoid buying items you wouldn't normally buy simply because they are on sale.  Check online and in newspapers for weekly deals.  Buy healthy items from the bulk bins when available, such as herbs, spices, flour, pasta, nuts, and dried fruit.  Buy fruits and vegetables that are in season. Prices are usually lower on in-season produce.  Look at the unit price on the price tag. Use it to compare different brands and sizes to find out which item is the best deal.  Choose healthy items that are often low-cost, such as carrots, potatoes, apples, bananas, and oranges. Dried or canned beans are a low-cost protein source.  Buy in bulk and freeze extra food. Items you can buy in bulk include meats, fish, poultry, frozen fruits, and frozen vegetables.  Avoid buying "ready-to-eat" foods, such as pre-cut fruits and vegetables and pre-made salads.  If possible, shop around to discover where you can find the best prices. Consider other retailers such as dollar stores, larger wholesale stores, local fruit and vegetable stands, and farmers markets.  Do not shop when you are hungry. If you shop while hungry, it may be hard to stick to your list and budget.  Resist impulse buying. Use your grocery   list as your official plan for the week.  Buy a variety of vegetables and fruits by purchasing fresh, frozen, and canned items.  Look at the top and bottom shelves for deals. Foods at eye level (eye level of an adult or child) are usually more expensive.  Be efficient with your time when shopping. The more time you spend at the store, the more money you are likely to spend.  To save money when choosing more expensive foods like meats and dairy: ? Choose cheaper cuts of meat, such as bone-in chicken thighs and drumsticks instead of skinless and boneless chicken. When you are ready to prepare the chicken, you can remove the skin yourself to make it healthier. ? Choose lean meats  like chicken or turkey instead of beef. ? Choose canned seafood, such as tuna, salmon, or sardines. ? Buy eggs as a low-cost source of protein. ? Buy dried beans and peas, such as lentils, split peas, or kidney beans instead of meats. Dried beans and peas are a good alternative source of protein. ? Buy the larger tubs of yogurt instead of individual-sized containers.  Choose water instead of sodas and other sweetened beverages.  Avoid buying chips, cookies, and other "junk food." These items are usually expensive and not healthy.   Cooking  Make extra food and freeze the extras in meal-sized containers or in individual portions for fast meals and snacks.  Pre-cook on days when you have extra time to prepare meals in advance. You can keep these meals in the fridge or freezer and reheat for a quick meal.  When you come home from the grocery store, wash, peel, and cut fruits and vegetables so they are ready to use and eat. This will help reduce food waste. Meal planning  Do not eat out or get fast food. Prepare food at home.  Make a grocery list and make sure to bring it with you to the store. If you have a smart phone, you could use your phone to create your shopping list.  Plan meals and snacks according to a grocery list and budget you create.  Use leftovers in your meal plan for the week.  Look for recipes where you can cook once and make enough food for two meals.  Prepare budget-friendly types of meals like stews, casseroles, and stir-fry dishes.  Try some meatless meals or try "no cook" meals like salads.  Make sure that half your plate is filled with fruits or vegetables. Choose from fresh, frozen, or canned fruits and vegetables. If eating canned, remember to rinse them before eating. This will remove any excess salt added for packaging. Summary  Eating healthy on a budget is possible if you plan your meals according to your budget, purchase according to your budget and  grocery list, and prepare food yourself.  Tips for buying more food on a limited budget include buying generic brands, using coupons only for foods you normally buy, and buying healthy items from the bulk bins when available.  Tips for buying cheaper food to replace expensive food include choosing cheaper, lean cuts of meat, and buying dried beans and peas. This information is not intended to replace advice given to you by your health care provider. Make sure you discuss any questions you have with your health care provider. Document Revised: 05/29/2020 Document Reviewed: 05/29/2020 Elsevier Patient Education  2021 Elsevier Inc.   Bone Health Bones protect organs, store calcium, anchor muscles, and support the whole body. Keeping your bones   strong is important, especially as you get older. You can take actions to help keep your bones strong and healthy. Why is keeping my bones healthy important? Keeping your bones healthy is important because your body constantly replaces bone cells. Cells get old, and new cells take their place. As we age, we lose bone cells because the body may not be able to make enough new cells to replace the old cells. The amount of bone cells and bone tissue you have is referred to as bone mass. The higher your bone mass, the stronger your bones. The aging process leads to an overall loss of bone mass in the body, which can increase the likelihood of:  Joint pain and stiffness.  Broken bones.  A condition in which the bones become weak and brittle (osteoporosis). A large decline in bone mass occurs in older adults. In women, it occurs about the time of menopause.   What actions can I take to keep my bones healthy? Good health habits are important for maintaining healthy bones. This includes eating nutritious foods and exercising regularly. To have healthy bones, you need to get enough of the right minerals and vitamins. Most nutrition experts recommend getting these  nutrients from the foods that you eat. In some cases, taking supplements may also be recommended. Doing certain types of exercise is also important for bone health. What are the nutritional recommendations for healthy bones? Eating a well-balanced diet with plenty of calcium and vitamin D will help to protect your bones. Nutritional recommendations vary from person to person. Ask your health care provider what is healthy for you. Here are some general guidelines. Get enough calcium Calcium is the most important (essential) mineral for bone health. Most people can get enough calcium from their diet, but supplements may be recommended for people who are at risk for osteoporosis. Good sources of calcium include:  Dairy products, such as low-fat or nonfat milk, cheese, and yogurt.  Dark green leafy vegetables, such as bok choy and broccoli.  Calcium-fortified foods, such as orange juice, cereal, bread, soy beverages, and tofu products.  Nuts, such as almonds. Follow these recommended amounts for daily calcium intake:  Children, age 1-3: 700 mg.  Children, age 4-8: 1,000 mg.  Children, age 9-13: 1,300 mg.  Teens, age 14-18: 1,300 mg.  Adults, age 19-50: 1,000 mg.  Adults, age 51-70: ? Men: 1,000 mg. ? Women: 1,200 mg.  Adults, age 71 or older: 1,200 mg.  Pregnant and breastfeeding females: ? Teens: 1,300 mg. ? Adults: 1,000 mg. Get enough vitamin D Vitamin D is the most essential vitamin for bone health. It helps the body absorb calcium. Sunlight stimulates the skin to make vitamin D, so be sure to get enough sunlight. If you live in a cold climate or you do not get outside often, your health care provider may recommend that you take vitamin D supplements. Good sources of vitamin D in your diet include:  Egg yolks.  Saltwater fish.  Milk and cereal fortified with vitamin D. Follow these recommended amounts for daily vitamin D intake:  Children and teens, age 1-18: 600  international units.  Adults, age 50 or younger: 400-800 international units.  Adults, age 51 or older: 800-1,000 international units. Get other important nutrients Other nutrients that are important for bone health include:  Phosphorus. This mineral is found in meat, poultry, dairy foods, nuts, and legumes. The recommended daily intake for adult men and adult women is 700 mg.  Magnesium. This mineral   is found in seeds, nuts, dark green vegetables, and legumes. The recommended daily intake for adult men is 400-420 mg. For adult women, it is 310-320 mg.  Vitamin K. This vitamin is found in green leafy vegetables. The recommended daily intake is 120 mg for adult men and 90 mg for adult women.   What type of physical activity is best for building and maintaining healthy bones? Weight-bearing and strength-building activities are important for building and maintaining healthy bones. Weight-bearing activities cause muscles and bones to work against gravity. Strength-building activities increase the strength of the muscles that support bones. Weight-bearing and muscle-building activities include:  Walking and hiking.  Jogging and running.  Dancing.  Gym exercises.  Lifting weights.  Tennis and racquetball.  Climbing stairs.  Aerobics. Adults should get at least 30 minutes of moderate physical activity on most days. Children should get at least 60 minutes of moderate physical activity on most days. Ask your health care provider what type of exercise is best for you.   How can I find out if my bone mass is low? Bone mass can be measured with an X-ray test called a bone mineral density (BMD) test. This test is recommended for all women who are age 65 or older. It may also be recommended for:  Men who are age 70 or older.  People who are at risk for osteoporosis because of: ? Having bones that break easily. ? Having a long-term disease that weakens bones, such as kidney disease or  rheumatoid arthritis. ? Having menopause earlier than normal. ? Taking medicine that weakens bones, such as steroids, thyroid hormones, or hormone treatment for breast cancer or prostate cancer. ? Smoking. ? Drinking three or more alcoholic drinks a day. If you find that you have a low bone mass, you may be able to prevent osteoporosis or further bone loss by changing your diet and lifestyle. Where can I find more information? For more information, check out the following websites:  National Osteoporosis Foundation: www.nof.org/patients  National Institutes of Health: www.bones.nih.gov  International Osteoporosis Foundation: www.iofbonehealth.org Summary  The aging process leads to an overall loss of bone mass in the body, which can increase the likelihood of broken bones and osteoporosis.  Eating a well-balanced diet with plenty of calcium and vitamin D will help to protect your bones.  Weight-bearing and strength-building activities are also important for building and maintaining strong bones.  Bone mass can be measured with an X-ray test called a bone mineral density (BMD) test. This information is not intended to replace advice given to you by your health care provider. Make sure you discuss any questions you have with your health care provider. Document Revised: 09/12/2017 Document Reviewed: 09/12/2017 Elsevier Patient Education  2021 Elsevier Inc.   

## 2021-01-09 NOTE — Progress Notes (Signed)
Gynecology Annual Exam  PCP: Patient, [name] (Inactive)  Chief Complaint: No chief complaint on file.  History of Present Illness: Patient is a 45 y.o. [id number] presents for annual exam. The patient has no complaints today.   LMP: No LMP recorded. Average Interval: regular, 30 days Duration of flow: 2 days  Heavy Menses: no Dysmenorrhea: no  She denies passage of large clots She denies sensations of gushing or flooding of blood. She denies accidents where she bleeds through her clothing. She denies that she changes a saturated pad or tampon more frequently than every hour.  She denies that pain from her periods limits her activities.  The patient does perform self breast exams.  There is no notable family history of breast or ovarian cancer in her family.  The patient has regular exercise: yes, walking and hiking  The patient denies current symptoms of depression.   Review of Systems: ROS  Past Medical History:  Past Medical History:  Diagnosis Date  . Anxiety   . Apnea   . Depression     Past Surgical History:  Past Surgical History:  Procedure Laterality Date  . CHOLECYSTECTOMY    . WISDOM TOOTH EXTRACTION     X4 extracted     Gynecologic History:  No LMP recorded. Menarche: 14  History of fibroids, polyps, or ovarian cysts? : no  History of PCOS? no Hstory of Endometriosis? no History of abnormal pap smears? no Have you had any sexually transmitted infections in the past?  no   Last Pap: Results were: 01/01/2019 NIL HPV negative  She identifies as a female. She is sexually active with men.   She has recently had some dyspareunia. She denies postcoital bleeding.    Obstetric History: [id number]  Family History:  Family History  Problem Relation Age of Onset  . Diabetes Father   . Diabetes Maternal Grandmother   . Breast cancer Neg Hx     Social History:  Social History   Socioeconomic History  . Marital status: Married    Spouse name:  Not on file  . Number of children: Not on file  . Years of education: Not on file  . Highest education level: Not on file  Occupational History  . Not on file  Tobacco Use  . Smoking status: Never Smoker  . Smokeless tobacco: Never Used  Vaping Use  . Vaping Use: Never used  Substance and Sexual Activity  . Alcohol use: Yes    Comment: occasional   . Drug use: Never  . Sexual activity: Yes    Birth control/protection: Surgical    Comment: Vasectomy   Other Topics Concern  . Not on file  Social History Narrative  . Not on file   Social Determinants of Health   Financial Resource Strain: Not on file  Food Insecurity: Not on file  Transportation Needs: Not on file  Physical Activity: Not on file  Stress: Not on file  Social Connections: Not on file  Intimate Partner Violence: Not on file    Allergies:  No Known Allergies  Medications: Prior to Admission medications   Medication Sig Start Date End Date Taking? Authorizing Provider  Cholecalciferol (VITAMIN D3) 125 MCG (5000 UT) TABS Take by mouth. 02/13/18   [provider]  escitalopram (LEXAPRO) 20 MG tablet TAKE 1 TABLET BY MOUTH EVERY DAY 09/19/20   Shamarcus Hoheisel R, MD  Multiple Vitamin (MULTIVITAMIN) tablet Take 1 tablet by mouth daily.    [provider]  terbinafine (LAMISIL) 250 MG tablet Take 1 tablet (250 mg total) by mouth daily. 01/08/20   Homero Fellers, MD    Physical Exam Vitals: There were no vitals taken for this visit.  Physical Exam Constitutional:      Appearance: She is well-developed.  Genitourinary:     Genitourinary Comments: External: Normal appearing vulva. No lesions noted.  Bimanual examination: Uterus midline, non-tender, normal in size, shape and contour.  No CMT. No adnexal masses. No adnexal tenderness. Pelvis not fixed.  Breast Exam: breast equal without skin changes, nipple discharge, breast lump or enlarged lymph nodes   HENT:     Head: Normocephalic  and atraumatic.  Neck:     Thyroid: No thyromegaly.  Cardiovascular:     Rate and Rhythm: Normal rate and regular rhythm.     Heart sounds: Normal heart sounds.  Pulmonary:     Effort: Pulmonary effort is normal.     Breath sounds: Normal breath sounds.  Abdominal:     General: Bowel sounds are normal. There is no distension.     Palpations: Abdomen is soft. There is no mass.  Musculoskeletal:     Cervical back: Neck supple.  Neurological:     Mental Status: She is alert and oriented to person, place, and time.  Skin:    General: Skin is warm and dry.  Psychiatric:        Behavior: Behavior normal.        Thought Content: Thought content normal.        Judgment: Judgment normal.  Vitals reviewed.      Female chaperone present for pelvic and breast  portions of the physical exam  Assessment: 45 y.o. G2P1102 routine annual exam  Plan: Problem List Items Addressed This Visit   None   Visit Diagnoses    Encounter for annual routine gynecological examination    -  Primary   Health maintenance examination       Breast cancer screening by mammogram       Encounter for gynecological examination without abnormal finding       Cervical cancer screening       Screening for diabetes mellitus       Relevant Orders   Comprehensive metabolic panel   Hemoglobin A1c   Screening for thyroid disorder       Relevant Orders   Lipid panel   Encounter for screening breast examination       Subacute vaginitis       Relevant Orders   NuSwab BV and Candida, NAA   Screening cholesterol level       Relevant Orders   Lipid panel   Breast calcifications on mammogram       Relevant Orders   MM DIAG BREAST TOMO BILATERAL   Abnormal microbiological finding in specimen from female genital organ       Relevant Orders   NuSwab BV and Candida, NAA   Fatigue, unspecified type       Relevant Orders   TSH   CBC With Differential      1) Mammogram - recommend yearly screening mammogram.   Mammogram was ordered today for August.  2) STI screening was offered and declined  3) ASCCP guidelines and rational discussed.  Will repeat next year.   4) Contraception - not needed  5) Colonoscopy -- start at 82  6) Routine healthcare maintenance including cholesterol, diabetes screening discussed to return fasting at a later date.   Teresa Berry  Gilman Schmidt MD, [facility], [facility] 01/09/2021 2:39 PM

## 2021-01-12 LAB — NUSWAB BV AND CANDIDA, NAA
Candida albicans, NAA: NEGATIVE
Candida glabrata, NAA: NEGATIVE

## 2021-01-15 ENCOUNTER — Other Ambulatory Visit: Payer: BC Managed Care – PPO

## 2021-01-15 ENCOUNTER — Other Ambulatory Visit: Payer: Self-pay

## 2021-01-16 LAB — COMPREHENSIVE METABOLIC PANEL
ALT: 25 IU/L (ref 0–32)
AST: 19 IU/L (ref 0–40)
Albumin/Globulin Ratio: 1.6 (ref 1.2–2.2)
Albumin: 4.7 g/dL (ref 3.8–4.8)
Alkaline Phosphatase: 69 IU/L (ref 44–121)
BUN/Creatinine Ratio: 15 (ref 9–23)
BUN: 12 mg/dL (ref 6–24)
Bilirubin Total: 0.4 mg/dL (ref 0.0–1.2)
CO2: 24 mmol/L (ref 20–29)
Calcium: 10 mg/dL (ref 8.7–10.2)
Chloride: 104 mmol/L (ref 96–106)
Creatinine, Ser: 0.81 mg/dL (ref 0.57–1.00)
Globulin, Total: 2.9 g/dL (ref 1.5–4.5)
Glucose: 102 mg/dL — ABNORMAL HIGH (ref 65–99)
Potassium: 4.7 mmol/L (ref 3.5–5.2)
Sodium: 141 mmol/L (ref 134–144)
Total Protein: 7.6 g/dL (ref 6.0–8.5)
eGFR: 92 mL/min/{1.73_m2} (ref >59)

## 2021-01-16 LAB — CBC WITH DIFFERENTIAL
Basophils Absolute: 0 10*3/uL (ref 0.0–0.2)
Basos: 1 %
EOS (ABSOLUTE): 0.3 10*3/uL (ref 0.0–0.4)
Eos: 5 %
Hematocrit: 37.8 % (ref 34.0–46.6)
Hemoglobin: 12.6 g/dL (ref 11.1–15.9)
Immature Grans (Abs): 0 10*3/uL (ref 0.0–0.1)
Immature Granulocytes: 0 %
Lymphocytes Absolute: 1.9 10*3/uL (ref 0.7–3.1)
Lymphs: 35 %
MCH: 30.8 pg (ref 26.6–33.0)
MCHC: 33.3 g/dL (ref 31.5–35.7)
MCV: 92 fL (ref 79–97)
Monocytes Absolute: 0.4 10*3/uL (ref 0.1–0.9)
Monocytes: 7 %
Neutrophils Absolute: 2.9 10*3/uL (ref 1.4–7.0)
Neutrophils: 52 %
RBC: 4.09 x10E6/uL (ref 3.77–5.28)
RDW: 11.7 % (ref 11.7–15.4)
WBC: 5.5 10*3/uL (ref 3.4–10.8)

## 2021-01-16 LAB — LIPID PANEL
Chol/HDL Ratio: 3.7 ratio (ref 0.0–4.4)
Cholesterol, Total: 201 mg/dL — ABNORMAL HIGH (ref 100–199)
HDL: 55 mg/dL (ref >39)
LDL Chol Calc (NIH): 129 mg/dL — ABNORMAL HIGH (ref 0–99)
Triglycerides: 94 mg/dL (ref 0–149)
VLDL Cholesterol Cal: 17 mg/dL (ref 5–40)

## 2021-01-16 LAB — HEMOGLOBIN A1C
Est. average glucose Bld gHb Est-mCnc: 108 mg/dL
Hgb A1c MFr Bld: 5.4 % (ref 4.8–5.6)

## 2021-01-16 LAB — TSH: TSH: 1.65 u[IU]/mL (ref 0.450–4.500)

## 2021-03-04 ENCOUNTER — Other Ambulatory Visit: Payer: Self-pay | Admitting: Obstetrics and Gynecology

## 2021-03-04 DIAGNOSIS — R928 Other abnormal and inconclusive findings on diagnostic imaging of breast: Secondary | ICD-10-CM

## 2021-03-04 NOTE — Progress Notes (Signed)
error 

## 2021-05-20 ENCOUNTER — Ambulatory Visit: Payer: BC Managed Care – PPO | Admitting: Dermatology

## 2021-05-20 ENCOUNTER — Other Ambulatory Visit: Payer: Self-pay

## 2021-05-20 DIAGNOSIS — D492 Neoplasm of unspecified behavior of bone, soft tissue, and skin: Secondary | ICD-10-CM

## 2021-05-20 DIAGNOSIS — Z86018 Personal history of other benign neoplasm: Secondary | ICD-10-CM

## 2021-05-20 DIAGNOSIS — L578 Other skin changes due to chronic exposure to nonionizing radiation: Secondary | ICD-10-CM | POA: Diagnosis not present

## 2021-05-20 DIAGNOSIS — Z1283 Encounter for screening for malignant neoplasm of skin: Secondary | ICD-10-CM

## 2021-05-20 DIAGNOSIS — D225 Melanocytic nevi of trunk: Secondary | ICD-10-CM | POA: Diagnosis not present

## 2021-05-20 DIAGNOSIS — D2371 Other benign neoplasm of skin of right lower limb, including hip: Secondary | ICD-10-CM

## 2021-05-20 DIAGNOSIS — L237 Allergic contact dermatitis due to plants, except food: Secondary | ICD-10-CM | POA: Diagnosis not present

## 2021-05-20 DIAGNOSIS — Z85828 Personal history of other malignant neoplasm of skin: Secondary | ICD-10-CM

## 2021-05-20 DIAGNOSIS — D1800 Hemangioma unspecified site: Secondary | ICD-10-CM

## 2021-05-20 DIAGNOSIS — D229 Melanocytic nevi, unspecified: Secondary | ICD-10-CM

## 2021-05-20 DIAGNOSIS — L814 Other melanin hyperpigmentation: Secondary | ICD-10-CM

## 2021-05-20 DIAGNOSIS — L821 Other seborrheic keratosis: Secondary | ICD-10-CM

## 2021-05-20 HISTORY — DX: Personal history of other benign neoplasm: Z86.018

## 2021-05-20 MED ORDER — CLOBETASOL PROPIONATE 0.05 % EX OINT: 1.0000 "application " | TOPICAL_OINTMENT | Freq: Two times a day (BID) | CUTANEOUS | 1 refills | Status: DC

## 2021-05-20 MED ORDER — MUPIROCIN 2 % EX OINT: TOPICAL_OINTMENT | CUTANEOUS | 1 refills | Status: DC

## 2021-05-20 NOTE — Progress Notes (Signed)
New Patient Visit  Subjective  Teresa Berry is a 45 y.o. female who presents for the following: Annual Exam (Requesting skin exam today. No personal hx of DN or skin cancer. Areas of concern on back. Grandfather Hx skin cancer, unsure of type.) and Rash (1 month. Poison ivy on arms and hands. No tx this episode. ).   Review of Systems: No other skin or systemic complaints except as noted in HPI or Assessment and Plan.   Objective  Well appearing patient in no apparent distress; mood and affect are within normal limits.  A full examination was performed including scalp, head, eyes, ears, nose, lips, neck, chest, axillae, abdomen, back, buttocks, bilateral upper extremities, bilateral lower extremities, hands, feet, fingers, toes, fingernails, and toenails. All findings within normal limits unless otherwise noted below.  arms, hands, abdomen Scaly thin pink plaques at arms, left abdomen, left thigh  Upper Mid Back 1.6 cm x 0.8  cm dark brown irregular crescent moon-shaped patch with adjacent 0.6 cm light brown macule. Patient has noticed for past 5-6 years.     Assessment & Plan  Allergic contact dermatitis due to plants, except food arms, hands, abdomen  Start Clobetasol 0.05% ointment BID up to 2 weeks PRN. Avoid applying to face, groin, and axilla. Use as directed. Risk of skin atrophy with long-term use reviewed.   Topical steroids (such as triamcinolone, fluocinolone, fluocinonide, mometasone, clobetasol, halobetasol, betamethasone, hydrocortisone) can cause thinning and lightening of the skin if they are used for too long in the same area. Your physician has selected the right strength medicine for your problem and area affected on the body. Please use your medication only as directed by your physician to prevent side effects.    clobetasol ointment (TEMOVATE) 0.05 % - arms, hands, abdomen Apply 1 application topically 2 (two) times daily. PRN poison ivy  Neoplasm of  skin Upper Mid Back  Epidermal / dermal shaving  Lesion diameter (cm):  1.6 Informed consent: discussed and consent obtained   Timeout: patient name, date of birth, surgical site, and procedure verified   Procedure prep:  Patient was prepped and draped in usual sterile fashion Prep type:  Isopropyl alcohol Anesthesia: the lesion was anesthetized in a standard fashion   Anesthetic:  1% lidocaine w/ epinephrine 1-100,000 buffered w/ 8.4% NaHCO3 Instrument used: flexible razor blade   Hemostasis achieved with: pressure, aluminum chloride and electrodesiccation   Outcome: patient tolerated procedure well   Post-procedure details: sterile dressing applied and wound care instructions given   Dressing type: bandage and petrolatum    mupirocin ointment (BACTROBAN) 2 % Apply qd with bandage change  Specimen 1 - Surgical pathology Differential Diagnosis: R/O MIS  Check Margins: No  Mupirocin QD with bandage change.  Lentigines - Scattered tan macules - Due to sun exposure - Benign-appearing, observe - Recommend daily broad spectrum sunscreen SPF 30+ to sun-exposed areas, reapply every 2 hours as needed. - Call for any changes  Seborrheic Keratoses - Stuck-on, waxy, tan-brown papules and/or plaques  - Benign-appearing - Discussed benign etiology and prognosis. - Observe - Call for any changes  Melanocytic Nevi - Tan-brown and/or pink-flesh-colored symmetric macules and papules - Benign appearing on exam today - Observation - Call clinic for new or changing moles - Recommend daily use of broad spectrum spf 30+ sunscreen to sun-exposed areas.   Hemangiomas - Red papules - Discussed benign nature - Observe - Call for any changes  Actinic Damage - Chronic condition, secondary to cumulative  UV/sun exposure - diffuse scaly erythematous macules with underlying dyspigmentation - Recommend daily broad spectrum sunscreen SPF 30+ to sun-exposed areas, reapply every 2 hours as  needed.  - Staying in the shade or wearing long sleeves, sun glasses (UVA+UVB protection) and wide brim hats (4-inch brim around the entire circumference of the hat) are also recommended for sun protection.  - Call for new or changing lesions.  Skin cancer screening performed today.  Dermatofibroma - Firm pink/brown papulenodule with dimple sign at right ankle - Benign appearing - Call for any changes   Return for TBSE in 1 year.  I, Emelia Salisbury, CMA, am acting as scribe for Forest Gleason, MD.  Documentation: I have reviewed the above documentation for accuracy and completeness, and I agree with the above.  Forest Gleason, MD

## 2021-05-20 NOTE — Patient Instructions (Addendum)
Melanoma ABCDEs  Melanoma is the most dangerous type of skin cancer, and is the leading cause of death from skin disease.  You are more likely to develop melanoma if you: Have light-colored skin, light-colored eyes, or red or blond hair Spend a lot of time in the sun Tan regularly, either outdoors or in a tanning bed Have had blistering sunburns, especially during childhood Have a close family member who has had a melanoma Have atypical moles or large birthmarks  Early detection of melanoma is key since treatment is typically straightforward and cure rates are extremely high if we catch it early.   The first sign of melanoma is often a change in a mole or a new dark spot.  The ABCDE system is a way of remembering the signs of melanoma.  A for asymmetry:  The two halves do not match. B for border:  The edges of the growth are irregular. C for color:  A mixture of colors are present instead of an even brown color. D for diameter:  Melanomas are usually (but not always) greater than 7mm - the size of a pencil eraser. E for evolution:  The spot keeps changing in size, shape, and color.  Please check your skin once per month between visits. You can use a small mirror in front and a large mirror behind you to keep an eye on the back side or your body.   If you see any new or changing lesions before your next follow-up, please call to schedule a visit.  Please continue daily skin protection including broad spectrum sunscreen SPF 30+ to sun-exposed areas, reapplying every 2 hours as needed when you're outdoors.   Staying in the shade or wearing long sleeves, sun glasses (UVA+UVB protection) and wide brim hats (4-inch brim around the entire circumference of the hat) are also recommended for sun protection.   Recommend taking Heliocare sun protection supplement daily in sunny weather for additional sun protection. For maximum protection on the sunniest days, you can take up to 2 capsules of regular  Heliocare OR take 1 capsule of Heliocare Ultra. For prolonged exposure (such as a full day in the sun), you can repeat your dose of the supplement 4 hours after your first dose. Heliocare can be purchased at [facility] or at [email or web address].   Recommend daily broad spectrum sunscreen SPF 30+ to sun-exposed areas, reapply every 2 hours as needed. Call for new or changing lesions.  Staying in the shade or wearing long sleeves, sun glasses (UVA+UVB protection) and wide brim hats (4-inch brim around the entire circumference of the hat) are also recommended for sun protection.    Wound Care Instructions  Cleanse wound gently with soap and water once a day then pat dry with clean gauze. Apply a thing coat of Petrolatum (petroleum jelly, "Vaseline") over the wound (unless you have an allergy to this). We recommend that you use a new, sterile tube of Vaseline. Do not pick or remove scabs. Do not remove the yellow or white "healing tissue" from the base of the wound.  Cover the wound with fresh, clean, nonstick gauze and secure with paper tape. You may use Band-Aids in place of gauze and tape if the would is small enough, but would recommend trimming much of the tape off as there is often too much. Sometimes Band-Aids can irritate the skin.  You should call the office for your biopsy report after 1 week if you have not already been contacted.  If  you experience any problems, such as abnormal amounts of bleeding, swelling, significant bruising, significant pain, or evidence of infection, please call the office immediately.  FOR ADULT SURGERY PATIENTS: If you need something for pain relief you may take 1 extra strength Tylenol (acetaminophen) AND 2 Ibuprofen (200mg  each) together every 4 hours as needed for pain. (do not take these if you are allergic to them or if you have a reason you should not take them.) Typically, you may only need pain medication for 1 to 3 days.

## 2021-05-26 ENCOUNTER — Encounter: Payer: Self-pay | Admitting: Dermatology

## 2021-05-27 ENCOUNTER — Telehealth: Payer: Self-pay

## 2021-05-27 ENCOUNTER — Other Ambulatory Visit: Payer: Self-pay

## 2021-05-27 ENCOUNTER — Ambulatory Visit
Admission: RE | Admit: 2021-05-27 | Discharge: 2021-05-27 | Disposition: A | Discharge status: Home / Self Care | Payer: BC Managed Care – PPO | Source: Ambulatory Visit | Attending: Obstetrics and Gynecology | Admitting: Obstetrics and Gynecology

## 2021-05-27 DIAGNOSIS — R921 Mammographic calcification found on diagnostic imaging of breast: Secondary | ICD-10-CM | POA: Diagnosis present

## 2021-05-27 NOTE — Telephone Encounter (Signed)
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 On the spectrum from normal mole to melanoma skin cancer, this is in between the two. - We need to recheck this area sometime in the next 3 months to be sure there is no evidence of the atypical mole coming back. If there is any color coming back, we would recommend repeating the biopsy to be sure the cells look normal.  - People who have a history of atypical moles do have a slightly increased risk of developing melanoma somewhere on the body, so a yearly full body skin exam by a dermatologist is recommended.  - Monthly self skin checks and daily sun protection are also recommended.  - Please call if you notice a dark spot coming back where this biopsy was taken.  - Please also call if you notice any new or changing spots anywhere else on the body before your follow-up visit.

## 2021-08-14 ENCOUNTER — Telehealth: Payer: Self-pay

## 2021-08-14 NOTE — Telephone Encounter (Signed)
Unable to leave a message as her voicemail box is not set up. We need to reschedule her appointment for next week since Dr. Laurence Ferrari will be out of the office.

## 2021-08-17 ENCOUNTER — Ambulatory Visit: Payer: BC Managed Care – PPO | Admitting: Dermatology

## 2021-08-17 ENCOUNTER — Other Ambulatory Visit: Payer: Self-pay

## 2021-08-17 DIAGNOSIS — D485 Neoplasm of uncertain behavior of skin: Secondary | ICD-10-CM

## 2021-08-17 DIAGNOSIS — D229 Melanocytic nevi, unspecified: Secondary | ICD-10-CM

## 2021-08-17 DIAGNOSIS — Z86018 Personal history of other benign neoplasm: Secondary | ICD-10-CM

## 2021-08-17 NOTE — Patient Instructions (Addendum)
Melanoma ABCDEs ? ?Melanoma is the most dangerous type of skin cancer, and is the leading cause of death from skin disease.  You are more likely to develop melanoma if you: ?Have light-colored skin, light-colored eyes, or red or blond hair ?Spend a lot of time in the sun ?Tan regularly, either outdoors or in a tanning bed ?Have had blistering sunburns, especially during childhood ?Have a close family member who has had a melanoma ?Have atypical moles or large birthmarks ? ?Early detection of melanoma is key since treatment is typically straightforward and cure rates are extremely high if we catch it early.  ? ?The first sign of melanoma is often a change in a mole or a new dark spot.  The ABCDE system is a way of remembering the signs of melanoma. ? ?A for asymmetry:  The two halves do not match. ?B for border:  The edges of the growth are irregular. ?C for color:  A mixture of colors are present instead of an even brown color. ?D for diameter:  Melanomas are usually (but not always) greater than 6mm - the size of a pencil eraser. ?E for evolution:  The spot keeps changing in size, shape, and color. ? ?Please check your skin once per month between visits. You can use a small mirror in front and a large mirror behind you to keep an eye on the back side or your body.  ? ?If you see any new or changing lesions before your next follow-up, please call to schedule a visit. ? ?Please continue daily skin protection including broad spectrum sunscreen SPF 30+ to sun-exposed areas, reapplying every 2 hours as needed when you're outdoors.  ? ?Staying in the shade or wearing long sleeves, sun glasses (UVA+UVB protection) and wide brim hats (4-inch brim around the entire circumference of the hat) are also recommended for sun protection.   ? ? ?If You Need Anything After Your Visit ? ?If you have any questions or concerns for your doctor, please call our main line at [phone] and press option 4 to reach your doctor's medical  assistant. If no one answers, please leave a voicemail as directed and we will return your call as soon as possible. Messages left after 4 pm will be answered the following business day.  ? ?You may also send us a message via MyChart. We typically respond to MyChart messages within 1-2 business days. ? ?For prescription refills, please ask your pharmacy to contact our office. Our fax number is [phone]. ? ?If you have an urgent issue when the clinic is closed that cannot wait until the next business day, you can page your doctor at the number below.   ? ?Please note that while we do our best to be available for urgent issues outside of office hours, we are not available 24/7.  ? ?If you have an urgent issue and are unable to reach us, you may choose to seek medical care at your doctor's office, retail clinic, urgent care center, or emergency room. ? ?If you have a medical emergency, please immediately call 911 or go to the emergency department. ? ?Pager Numbers ? ?- Dr. Kowalski: [phone] ? ?- Dr. Moye: [phone] ? ?- Dr. Stewart: [phone] ? ?In the event of inclement weather, please call our main line at [phone] for an update on the status of any delays or closures. ? ?Dermatology Medication Tips: ?Please keep the boxes that topical medications come in in order to help keep track of the instructions   about where and how to use these. Pharmacies typically print the medication instructions only on the boxes and not directly on the medication tubes.  ? ?If your medication is too expensive, please contact our office at [phone] option 4 or send us a message through MyChart.  ? ?We are unable to tell what your co-pay for medications will be in advance as this is different depending on your insurance coverage. However, we may be able to find a substitute medication at lower cost or fill out paperwork to get insurance to cover a needed medication.  ? ?If a prior authorization is required to get your  medication covered by your insurance company, please allow us 1-2 business days to complete this process. ? ?Drug prices often vary depending on where the prescription is filled and some pharmacies may offer cheaper prices. ? ?The website www.goodrx.com contains coupons for medications through different pharmacies. The prices here do not account for what the cost may be with help from insurance (it may be cheaper with your insurance), but the website can give you the price if you did not use any insurance.  ?- You can print the associated coupon and take it with your prescription to the pharmacy.  ?- You may also stop by our office during regular business hours and pick up a GoodRx coupon card.  ?- If you need your prescription sent electronically to a different pharmacy, notify our office through  MyChart or by phone at [phone] option 4. ? ? ? ? ?Si Usted Necesita Algo Despu?s de Su Visita ? ?Tambi?n puede enviarnos un mensaje a trav?s de MyChart. Por lo general respondemos a los mensajes de MyChart en el transcurso de 1 a 2 d?as h?biles. ? ?Para renovar recetas, por favor pida a su farmacia que se ponga en contacto con nuestra oficina. Nuestro n?mero de fax es el [phone]. ? ?Si tiene un asunto urgente cuando la cl?nica est? cerrada y que no puede esperar hasta el siguiente d?a h?bil, puede llamar/localizar a su doctor(a) al n?mero que aparece a continuaci?n.  ? ?Por favor, tenga en cuenta que aunque hacemos todo lo posible para estar disponibles para asuntos urgentes fuera del horario de oficina, no estamos disponibles las 24 horas del d?a, los 7 d?as de la semana.  ? ?Si tiene un problema urgente y no puede comunicarse con nosotros, puede optar por buscar atenci?n m?dica  en el consultorio de su doctor(a), en una cl?nica privada, en un centro de atenci?n urgente o en una sala de emergencias. ? ?Si tiene una emergencia m?dica, por favor llame inmediatamente al 911 o vaya a la sala de  emergencias. ? ?N?meros de b?per ? ?- Dr. Kowalski: [phone] ? ?- Dra. Moye: [phone] ? ?- Dra. Stewart: [phone] ? ?En caso de inclemencias del tiempo, por favor llame a nuestra l?nea principal al [phone] para una actualizaci?n sobre el estado de cualquier retraso o cierre. ? ?Consejos para la medicaci?n en dermatolog?a: ?Por favor, guarde las cajas en las que vienen los medicamentos de uso t?pico para ayudarle a seguir las instrucciones sobre d?nde y c?mo usarlos. Las farmacias generalmente imprimen las instrucciones del medicamento s?lo en las cajas y no directamente en los tubos del medicamento.  ? ?Si su medicamento es muy caro, por favor, p?ngase en contacto con nuestra oficina llamando al [phone] y presione la opci?n 4 o env?enos un mensaje a trav?s de MyChart.  ? ?No podemos decirle cu?l ser? su copago por los medicamentos por adelantado ya que   esto es diferente dependiendo de la cobertura de su seguro. Sin embargo, es posible que podamos encontrar un medicamento sustituto a menor costo o llenar un formulario para que el seguro cubra el medicamento que se considera necesario.  ? ?Si se requiere una autorizaci?n previa para que su compa??a de seguros cubra su medicamento, por favor perm?tanos de 1 a 2 d?as h?biles para completar este proceso. ? ?Los precios de los medicamentos var?an con frecuencia dependiendo del lugar de d?nde se surte la receta y alguna farmacias pueden ofrecer precios m?s baratos. ? ?El sitio web www.goodrx.com tiene cupones para medicamentos de diferentes farmacias. Los precios aqu? no tienen en cuenta lo que podr?a costar con la ayuda del seguro (puede ser m?s barato con su seguro), pero el sitio web puede darle el precio si no utiliz? ning?n seguro.  ?- Puede imprimir el cup?n correspondiente y llevarlo con su receta a la farmacia.  ?- Tambi?n puede pasar por nuestra oficina durante el horario de atenci?n regular y recoger una tarjeta de cupones de GoodRx.  ?- Si  necesita que su receta se env?e electr?nicamente a una farmacia diferente, informe a nuestra oficina a trav?s de MyChart de [place] o por tel?fono llamando al [phone] y presione la opci?n 4. ? ?

## 2021-08-17 NOTE — Progress Notes (Signed)
° °  Follow-Up Visit   Subjective  Teresa Berry is a 45 y.o. female who presents for the following: Follow-up (Patient here today for recheck of mole that was removed 3 months ago at upper mid back. ). The patient has spots; moles and lesions to be evaluated, some may be new or changing and the patient has concerns that these could be cancer.  The following portions of the chart were reviewed this encounter and updated as appropriate:  Tobacco   Allergies   Meds   Problems   Med Hx   Surg Hx   Fam Hx      Review of Systems: No other skin or systemic complaints except as noted in HPI or Assessment and Plan.  Objective  Well appearing patient in no apparent distress; mood and affect are within normal limits.  A focused examination was performed including back. Relevant physical exam findings are noted in the Assessment and Plan.   Assessment & Plan   History of Dysplastic Nevi - No evidence of recurrence today at upper mid back (2022) - Recommend regular full body skin exams - Recommend daily broad spectrum sunscreen SPF 30+ to sun-exposed areas, reapply every 2 hours as needed.  - Call if any new or changing lesions are noted between office visits  Return for keep follow up as scheduled 04/2022. IRuthell Rummage, CMA, am acting as scribe for Sarina Ser, MD. Documentation: I have reviewed the above documentation for accuracy and completeness, and I agree with the above.  Sarina Ser, MD

## 2021-08-19 ENCOUNTER — Ambulatory Visit: Payer: BC Managed Care – PPO | Admitting: Dermatology

## 2021-08-29 ENCOUNTER — Encounter: Payer: Self-pay | Admitting: Dermatology

## 2021-09-23 ENCOUNTER — Encounter: Payer: Self-pay | Admitting: Obstetrics and Gynecology

## 2021-09-23 ENCOUNTER — Other Ambulatory Visit: Payer: Self-pay | Admitting: Obstetrics and Gynecology

## 2021-09-23 DIAGNOSIS — F320 Major depressive disorder, single episode, mild: Secondary | ICD-10-CM

## 2021-09-23 MED ORDER — ESCITALOPRAM OXALATE 20 MG PO TABS: 20.0000 mg | ORAL_TABLET | Freq: Every day | ORAL | 5 refills | Status: DC

## 2022-01-17 NOTE — Progress Notes (Signed)
PCP:  Patient, [name] (Inactive)   Chief Complaint  Patient presents with   Gynecologic Exam    No concerns     HPI:      Ms. Teresa Berry is a 46 y.o. [id number] whose LMP was Patient's last menstrual period was 01/18/2022 (exact date)., presents today for her annual examination.  Her menses are regular every 28-30 days, lasting 3 days.  Dysmenorrhea mild, no BTB.  Sex activity: single partner, contraception - vasectomy.  Last Pap: 01/01/19  Results were: no abnormalities /neg HPV DNA   Last mammogram: 05/27/21 Results were: cat 3 stable breast calcifications; repeat due after 12 months There is no FH of breast cancer. There is no FH of ovarian cancer. The patient does do self-breast exams.  Tobacco use: The patient denies current or previous tobacco use. Alcohol use: social drinker No drug use.  Exercise: min active  She does get adequate calcium but not enough Vitamin D in her diet. Hx of Vit D deficiency in past.   On lexapro 20 mg for depression with sx control. Wants to cont.  Has noticed wt gain in past wk or so. Has done low carb for a couple yrs with 30# wt loss in past. Hasn't changed diet/exercise recently and noticed 5# gain last wk alone. Had normal TSH/HgA1C 5/22. Hx of constipation every couple wks.   Patient Active Problem List   Diagnosis Date Noted   Current mild episode of major depressive disorder without prior episode ([place]) 01/18/2022   Vitamin D deficiency 02/13/2018   Depression, major, in remission ([place]) 10/03/2016   Obesity 10/02/2013    Past Surgical History:  Procedure Laterality Date   CHOLECYSTECTOMY     WISDOM TOOTH EXTRACTION     X4 extracted     Family History  Problem Relation Age of Onset   Diabetes Father    Diabetes Maternal Grandmother    Breast cancer Neg Hx     Social History   Socioeconomic History   Marital status: Married    Spouse name: Not on file   Number of children: Not on file   Years of education: Not on  file   Highest education level: Not on file  Occupational History   Not on file  Tobacco Use   Smoking status: Never   Smokeless tobacco: Never  Vaping Use   Vaping Use: Never used  Substance and Sexual Activity   Alcohol use: Yes    Comment: occasional    Drug use: Never   Sexual activity: Yes    Birth control/protection: Surgical    Comment: Vasectomy   Other Topics Concern   Not on file  Social History Narrative   Not on file   Social Determinants of Health   Financial Resource Strain: Not on file  Food Insecurity: Not on file  Transportation Needs: Not on file  Physical Activity: Not on file  Stress: Not on file  Social Connections: Not on file  Intimate Partner Violence: Not on file     Current Outpatient Medications:    Azelastine HCl 137 MCG/SPRAY SOLN, Place 1 spray into both nostrils 2 (two) times daily., Disp: , Rfl:    clobetasol ointment (TEMOVATE) 2.54 %, Apply 1 application topically 2 (two) times daily. PRN poison ivy, Disp: 60 g, Rfl: 1   Multiple Vitamin (MULTIVITAMIN) tablet, Take 1 tablet by mouth daily., Disp: , Rfl:    mupirocin ointment (BACTROBAN) 2 %, Apply qd with bandage change, Disp: 22  g, Rfl: 1   escitalopram (LEXAPRO) 20 MG tablet, Take 1 tablet (20 mg total) by mouth daily., Disp: 90 tablet, Rfl: 3     ROS:  Review of Systems  Constitutional:  Negative for fatigue, fever and unexpected weight change.  Respiratory:  Negative for cough, shortness of breath and wheezing.   Cardiovascular:  Negative for chest pain, palpitations and leg swelling.  Gastrointestinal:  Positive for constipation. Negative for blood in stool, diarrhea, nausea and vomiting.  Endocrine: Negative for cold intolerance, heat intolerance and polyuria.  Genitourinary:  Negative for dyspareunia, dysuria, flank pain, frequency, genital sores, hematuria, menstrual problem, pelvic pain, urgency, vaginal bleeding, vaginal discharge and vaginal pain.  Musculoskeletal:   Negative for back pain, joint swelling and myalgias.  Skin:  Negative for rash.  Neurological:  Negative for dizziness, syncope, light-headedness, numbness and headaches.  Hematological:  Negative for adenopathy.  Psychiatric/Behavioral:  Negative for agitation, confusion, sleep disturbance and suicidal ideas. The patient is not nervous/anxious.   BREAST: No symptoms   Objective: BP 120/80   Ht '5\' 9"'$  (1.753 m)   Wt 209 lb (94.8 kg)   LMP 01/18/2022 (Exact Date)   BMI 30.86 kg/m    Physical Exam Constitutional:      Appearance: She is well-developed.  Genitourinary:     Vulva normal.     Right Labia: No rash, tenderness or lesions.    Left Labia: No tenderness, lesions or rash.    No vaginal discharge, erythema or tenderness.      Right Adnexa: not tender and no mass present.    Left Adnexa: not tender and no mass present.    No cervical friability or polyp.     Uterus is not enlarged or tender.  Breasts:    Right: No mass, nipple discharge, skin change or tenderness.     Left: No mass, nipple discharge, skin change or tenderness.  Neck:     Thyroid: No thyromegaly.  Cardiovascular:     Rate and Rhythm: Normal rate and regular rhythm.     Heart sounds: Normal heart sounds. No murmur heard. Pulmonary:     [place]: Pulmonary effort is normal.     Breath sounds: Normal breath sounds.  Abdominal:     Palpations: Abdomen is soft.     Tenderness: There is no abdominal tenderness. There is no guarding or rebound.  Musculoskeletal:        General: Normal range of motion.     Cervical back: Normal range of motion.  Lymphadenopathy:     Cervical: No cervical adenopathy.  Neurological:     General: No focal deficit present.     Mental Status: She is alert and oriented to person, place, and time.     Cranial Nerves: No cranial nerve deficit.  Skin:    General: Skin is warm and dry.  Psychiatric:        Mood and Affect: Mood normal.        Behavior: Behavior normal.         Thought Content: Thought content normal.        Judgment: Judgment normal.  Vitals reviewed.    Assessment/Plan: Encounter for annual routine gynecological examination  Encounter for screening mammogram for malignant neoplasm of breast - Plan: US BREAST LTD UNI RIGHT INC AXILLA, US BREAST LTD UNI LEFT INC AXILLA, MM DIAG BREAST TOMO BILATERAL; pt to schedule mammo 9/23  Follow-up examination of abnormal mammogram - Plan: US BREAST LTD UNI RIGHT INC AXILLA, US  BREAST LTD UNI LEFT INC AXILLA, MM DIAG BREAST TOMO BILATERAL  Current mild episode of major depressive disorder without prior episode ([place]) - Plan: escitalopram (LEXAPRO) 20 MG tablet; Rx RF. Doing well.   Weight gain--tweak diet/increase exercise. If sx cont, pt to RTO for lab check. Orders already placed. F/u prn.   Blood tests for routine general physical examination - Plan: TSH, T4, free, Hemoglobin A1c  Thyroid disorder screening - Plan: TSH, T4, free  Screening for diabetes mellitus - Plan: Hemoglobin A1c  BMI 30.0-30.9,adult - Plan: Hemoglobin A1c   Meds ordered this encounter  Medications   escitalopram (LEXAPRO) 20 MG tablet    Sig: Take 1 tablet (20 mg total) by mouth daily.    Dispense:  90 tablet    Refill:  3    Order Specific Question:   Supervising Provider    Answer:   CONSTANT, PEGGY [[id number]]             GYN counsel breast self exam, mammography screening, adequate intake of calcium and vitamin D, diet and exercise     F/U  Return in about 1 year (around 01/19/2023).  Glorious Flicker B. Ansen Sayegh, PA-C 01/18/2022 3:24 PM

## 2022-01-18 ENCOUNTER — Ambulatory Visit (INDEPENDENT_AMBULATORY_CARE_PROVIDER_SITE_OTHER): Payer: BC Managed Care – PPO | Admitting: Obstetrics and Gynecology

## 2022-01-18 ENCOUNTER — Encounter: Payer: Self-pay | Admitting: Obstetrics and Gynecology

## 2022-01-18 VITALS — BP 120/80 | Ht 69.0 in | Wt 209.0 lb

## 2022-01-18 DIAGNOSIS — Z0000 Encounter for general adult medical examination without abnormal findings: Secondary | ICD-10-CM

## 2022-01-18 DIAGNOSIS — F320 Major depressive disorder, single episode, mild: Secondary | ICD-10-CM | POA: Insufficient documentation

## 2022-01-18 DIAGNOSIS — R928 Other abnormal and inconclusive findings on diagnostic imaging of breast: Secondary | ICD-10-CM

## 2022-01-18 DIAGNOSIS — Z1231 Encounter for screening mammogram for malignant neoplasm of breast: Secondary | ICD-10-CM | POA: Diagnosis not present

## 2022-01-18 DIAGNOSIS — Z01419 Encounter for gynecological examination (general) (routine) without abnormal findings: Secondary | ICD-10-CM | POA: Diagnosis not present

## 2022-01-18 DIAGNOSIS — Z131 Encounter for screening for diabetes mellitus: Secondary | ICD-10-CM

## 2022-01-18 DIAGNOSIS — Z1329 Encounter for screening for other suspected endocrine disorder: Secondary | ICD-10-CM

## 2022-01-18 DIAGNOSIS — Z6830 Body mass index (BMI) 30.0-30.9, adult: Secondary | ICD-10-CM

## 2022-01-18 DIAGNOSIS — R635 Abnormal weight gain: Secondary | ICD-10-CM

## 2022-01-18 MED ORDER — ESCITALOPRAM OXALATE 20 MG PO TABS: 20.0000 mg | ORAL_TABLET | Freq: Every day | ORAL | 3 refills | Status: DC

## 2022-01-18 NOTE — Patient Instructions (Signed)
I value your feedback and you entrusting us with your care. If you get a [facility] patient survey, I would appreciate you taking the time to let us know about your experience today. Thank you!  [facility]: [phone]      

## 2022-03-22 ENCOUNTER — Encounter: Payer: Self-pay | Admitting: Dermatology

## 2022-04-06 ENCOUNTER — Encounter: Payer: Self-pay | Admitting: Dermatology

## 2022-04-06 ENCOUNTER — Ambulatory Visit: Payer: BC Managed Care – PPO | Admitting: Dermatology

## 2022-04-06 DIAGNOSIS — D239 Other benign neoplasm of skin, unspecified: Secondary | ICD-10-CM

## 2022-04-06 DIAGNOSIS — D225 Melanocytic nevi of trunk: Secondary | ICD-10-CM | POA: Diagnosis not present

## 2022-04-06 DIAGNOSIS — D492 Neoplasm of unspecified behavior of bone, soft tissue, and skin: Secondary | ICD-10-CM

## 2022-04-06 HISTORY — DX: Other benign neoplasm of skin, unspecified: D23.9

## 2022-04-06 NOTE — Patient Instructions (Addendum)
Wound Care Instructions  Cleanse wound gently with soap and water once a day then pat dry with clean gauze. Apply a thin coat of Petrolatum (petroleum jelly, "Vaseline") over the wound (unless you have an allergy to this). We recommend that you use a new, sterile tube of Vaseline. Do not pick or remove scabs. Do not remove the yellow or white "healing tissue" from the base of the wound.  Cover the wound with fresh, clean, nonstick gauze and secure with paper tape. You may use Band-Aids in place of gauze and tape if the wound is small enough, but would recommend trimming much of the tape off as there is often too much. Sometimes Band-Aids can irritate the skin.  You should call the office for your biopsy report after 1 week if you have not already been contacted.  If you experience any problems, such as abnormal amounts of bleeding, swelling, significant bruising, significant pain, or evidence of infection, please call the office immediately.  FOR ADULT SURGERY PATIENTS: If you need something for pain relief you may take 1 extra strength Tylenol (acetaminophen) AND 2 Ibuprofen ('200mg'$  each) together every 4 hours as needed for pain. (do not take these if you are allergic to them or if you have a reason you should not take them.) Typically, you may only need pain medication for 1 to 3 days.    Recommend taking Heliocare sun protection supplement daily in sunny weather for additional sun protection. For maximum protection on the sunniest days, you can take up to 2 capsules of regular Heliocare OR take 1 capsule of Heliocare Ultra. For prolonged exposure (such as a full day in the sun), you can repeat your dose of the supplement 4 hours after your first dose. Heliocare can be purchased at [employer], at some [facility] or at [email or web address].     Due to recent changes in healthcare laws, you may see results of your pathology and/or laboratory studies on MyChart before the doctors have had a  chance to review them. We understand that in some cases there may be results that are confusing or concerning to you. Please understand that not all results are received at the same time and often the doctors may need to interpret multiple results in order to provide you with the best plan of care or course of treatment. Therefore, we ask that you please give [place] 2 business days to thoroughly review all your results before contacting the office for clarification. Should we see a critical lab result, you will be contacted sooner.   If You Need Anything After Your Visit  If you have any questions or concerns for your doctor, please call our main line at [phone] and press option 4 to reach your doctor's medical assistant. If no one answers, please leave a voicemail as directed and we will return your call as soon as possible. Messages left after 4 pm will be answered the following business day.   You may also send [place] a message via [place]. We typically respond to MyChart messages within 1-2 business days.  For prescription refills, please ask your pharmacy to contact our office. Our fax number is [phone].  If you have an urgent issue when the clinic is closed that cannot wait until the next business day, you can page your doctor at the number below.    Please note that while we do our best to be available for urgent issues outside of office hours, we are not available 24/7.  office hours, we are not available 24/7.   If you have an urgent issue and are unable to reach us, you may choose to seek medical care at your doctor's office, retail clinic, urgent care center, or emergency room.  If you have a medical emergency, please immediately call 911 or go to the emergency department.  Pager Numbers  - Dr. Kowalski: [phone]  - Dr. Moye: [phone]  - Dr. Stewart: [phone]  In the event of inclement weather, please call our main line at [phone] for an update on the status of any delays or  closures.  Dermatology Medication Tips: Please keep the boxes that topical medications come in in order to help keep track of the instructions about where and how to use these. Pharmacies typically print the medication instructions only on the boxes and not directly on the medication tubes.   If your medication is too expensive, please contact our office at [phone] option 4 or send us a message through MyChart.   We are unable to tell what your co-pay for medications will be in advance as this is different depending on your insurance coverage. However, we may be able to find a substitute medication at lower cost or fill out paperwork to get insurance to cover a needed medication.   If a prior authorization is required to get your medication covered by your insurance company, please allow us 1-2 business days to complete this process.  Drug prices often vary depending on where the prescription is filled and some pharmacies may offer cheaper prices.  The website www.goodrx.com contains coupons for medications through different pharmacies. The prices here do not account for what the cost may be with help from insurance (it may be cheaper with your insurance), but the website can give you the price if you did not use any insurance.  - You can print the associated coupon and take it with your prescription to the pharmacy.  - You may also stop by our office during regular business hours and pick up a GoodRx coupon card.  - If you need your prescription sent electronically to a different pharmacy, notify our office through [facility] MyChart or by phone at [phone] option 4.     Si Usted Necesita Algo Despus de Su Visita  Tambin puede enviarnos un mensaje a travs de MyChart. Por lo general respondemos a los mensajes de MyChart en el transcurso de 1 a 2 das hbiles.  Para renovar recetas, por favor pida a su farmacia que se ponga en contacto con nuestra oficina. Nuestro nmero de fax  es el [phone].  Si tiene un asunto urgente cuando la clnica est cerrada y que no puede esperar hasta el siguiente da hbil, puede llamar/localizar a su doctor(a) al nmero que aparece a continuacin.   Por favor, tenga en cuenta que aunque hacemos todo lo posible para estar disponibles para asuntos urgentes fuera del horario de oficina, no estamos disponibles las 24 horas del da, los 7 das de la semana.   Si tiene un problema urgente y no puede comunicarse con nosotros, puede optar por buscar atencin mdica  en el consultorio de su doctor(a), en una clnica privada, en un centro de atencin urgente o en una sala de emergencias.  Si tiene una emergencia mdica, por favor llame inmediatamente al 911 o vaya a la sala de emergencias.  Nmeros de bper  - Dr. Kowalski: [phone]  - Dra. Moye: [phone]  - Dra. Stewart: [phone]  En caso de inclemencias   al [phone] para una actualizacin sobre el [place] de cualquier retraso o cierre.  Consejos para la medicacin en dermatologa: Por favor, guarde las cajas en las que vienen los medicamentos de uso tpico para ayudarle a seguir las instrucciones sobre dnde y cmo usarlos. Las farmacias generalmente imprimen las instrucciones del medicamento slo en las cajas y no directamente en los tubos del [place].   Si su medicamento es muy caro, por favor, pngase en contacto con Zigmund Daniel llamando al [phone] y presione la opcin 4 o envenos un mensaje a travs de Pharmacist, community.   No podemos decirle cul ser su copago por los medicamentos por adelantado ya que esto es diferente dependiendo de la cobertura de su seguro. Sin embargo, es posible que podamos encontrar un medicamento sustituto a Electrical engineer un formulario para que el seguro cubra el medicamento que se considera necesario.   Si se requiere una autorizacin previa para que su compaa de seguros [place] su medicamento, por favor  permtanos de 1 a 2 das hbiles para completar este proceso.  Los precios de los medicamentos varan con frecuencia dependiendo del Environmental consultant de dnde se surte la receta y alguna farmacias pueden ofrecer precios ms baratos.  El sitio web www.goodrx.com tiene cupones para medicamentos de Airline pilot. Los precios aqu no tienen en cuenta lo que podra costar con la ayuda del seguro (puede ser ms barato con su seguro), pero el sitio web puede darle el precio si no utiliz Research scientist (physical sciences).  - Puede imprimir el cupn correspondiente y llevarlo con su receta a la farmacia.  - Tambin puede pasar por nuestra oficina durante el horario de atencin regular y Charity fundraiser una tarjeta de cupones de GoodRx.  - Si necesita que su receta se enve electrnicamente a una farmacia diferente, informe a nuestra oficina a travs de MyChart de [place] o por telfono llamando al [phone] y presione la opcin 4.

## 2022-04-06 NOTE — Progress Notes (Signed)
   Follow-Up Visit   Subjective  Teresa Berry is a 46 y.o. female who presents for the following: Follow-up (Recurrence of color at biopsy site. Mid upper back. Hx of DN with moderate atypia, close to margin. Bx 05/20/2021).  The patient has spots, moles and lesions to be evaluated, some may be new or changing and the patient has concerns that these could be cancer.   The following portions of the chart were reviewed this encounter and updated as appropriate:  Tobacco  Allergies  Meds  Problems  Med Hx  Surg Hx  Fam Hx      Review of Systems: No other skin or systemic complaints except as noted in HPI or Assessment and Plan.   Objective  Well appearing patient in no apparent distress; mood and affect are within normal limits.  A focused examination was performed including back. Relevant physical exam findings are noted in the Assessment and Plan.  Mid Upper Back Multiple brown macules clustered within scar   Assessment & Plan  Neoplasm of skin Mid Upper Back  Epidermal / dermal shaving  Lesion diameter (cm):  1.1 Informed consent: discussed and consent obtained   Patient was prepped and draped in usual sterile fashion: Area prepped with alcohol. Anesthesia: the lesion was anesthetized in a standard fashion   Anesthetic:  1% lidocaine w/ epinephrine 1-100,000 buffered w/ 8.4% NaHCO3 Instrument used: flexible razor blade   Hemostasis achieved with: pressure, aluminum chloride and electrodesiccation   Outcome: patient tolerated procedure well   Post-procedure details: wound care instructions given   Post-procedure details comment:  Ointment and small bandage applied  Specimen 1 - Surgical pathology Differential Diagnosis: R/O recurrent dysplastic nevus with moderate atypia  Check Margins: No  Previous Bx: [id number]  Related Medications mupirocin ointment (BACTROBAN) 2 % Apply qd with bandage change   Return for TBSE As Scheduled.  I, Emelia Salisbury,  CMA, am acting as scribe for Forest Gleason, MD.  Documentation: I have reviewed the above documentation for accuracy and completeness, and I agree with the above.  Forest Gleason, MD

## 2022-04-07 ENCOUNTER — Telehealth: Payer: Self-pay

## 2022-04-07 NOTE — Telephone Encounter (Addendum)
Called and discussed bx results with patient. She verbalized understanding and denied other questions at this time.   ----- Message from Alfonso Patten, MD sent at 04/07/2022  3:11 PM EDT ----- Skin , mid upper back RECURRENT DYSPLASTIC NEVUS, MARGIN FREE "atypical mole cells coming back, edges look clear". No additional treatment needed. Will continue to monitor.  MAs please call. Thank you!

## 2022-04-19 ENCOUNTER — Encounter: Payer: Self-pay | Admitting: Dermatology

## 2022-05-27 ENCOUNTER — Ambulatory Visit: Payer: BC Managed Care – PPO | Admitting: Dermatology

## 2022-05-27 DIAGNOSIS — Z1283 Encounter for screening for malignant neoplasm of skin: Secondary | ICD-10-CM

## 2022-05-27 DIAGNOSIS — L821 Other seborrheic keratosis: Secondary | ICD-10-CM

## 2022-05-27 DIAGNOSIS — L578 Other skin changes due to chronic exposure to nonionizing radiation: Secondary | ICD-10-CM | POA: Diagnosis not present

## 2022-05-27 DIAGNOSIS — Z86018 Personal history of other benign neoplasm: Secondary | ICD-10-CM | POA: Diagnosis not present

## 2022-05-27 DIAGNOSIS — D229 Melanocytic nevi, unspecified: Secondary | ICD-10-CM

## 2022-05-27 DIAGNOSIS — R21 Rash and other nonspecific skin eruption: Secondary | ICD-10-CM | POA: Diagnosis not present

## 2022-05-27 DIAGNOSIS — L814 Other melanin hyperpigmentation: Secondary | ICD-10-CM

## 2022-05-27 DIAGNOSIS — D1801 Hemangioma of skin and subcutaneous tissue: Secondary | ICD-10-CM

## 2022-05-27 MED ORDER — TRIAMCINOLONE ACETONIDE 0.1 % EX CREA: TOPICAL_CREAM | CUTANEOUS | 1 refills | Status: DC

## 2022-05-27 NOTE — [place] Notes (Signed)
Follow-Up Visit   Subjective  Teresa Berry is a 46 y.o. female who presents for the following: Annual Exam (Hx dysplastic nevi. ). The patient presents for Total-Body Skin Exam (TBSE) for skin cancer screening and mole check.  The patient has spots, moles and lesions to be evaluated, some may be new or changing and the patient has concerns that these could be cancer.  Patient has noticed some dryness behind both ears for about 1 month, has been using cortisone to treat.    The following portions of the chart were reviewed this encounter and updated as appropriate:   Tobacco  Allergies  Meds  Problems  Med Hx  Surg Hx  Fam Hx      Review of Systems:  No other skin or systemic complaints except as noted in HPI or Assessment and Plan.  Objective  Well appearing patient in no apparent distress; mood and affect are within normal limits.  A full examination was performed including scalp, head, eyes, ears, nose, lips, neck, chest, axillae, abdomen, back, buttocks, bilateral upper extremities, bilateral lower extremities, hands, feet, fingers, toes, fingernails, and toenails. All findings within normal limits unless otherwise noted below.  postauricular Well demarcated pink plaques with faint scale at bilateral postauricular     Assessment & Plan  Rash postauricular  Ddx seborrheic dermatitis vs allergic contact dermatitis to sunglasses  Consider changing to different style sunglasses; see if condition improves in less sunny months with less sunglasses wear  Chronic and persistent condition with expected duration over one year. Condition is bothersome/symptomatic for patient. Currently flared.  Start TMC 0.1% cream twice daily for up to 2-3 weeks. Avoid applying to face, groin, and axilla. Use as directed. Long-term use can cause thinning of the skin. Can repeat as needed but for frequent flares, patient to contact clinic to consider alternative topical such as  elidel.  Topical steroids (such as triamcinolone, fluocinolone, fluocinonide, mometasone, clobetasol, halobetasol, betamethasone, hydrocortisone) can cause thinning and lightening of the skin if they are used for too long in the same area. Your physician has selected the right strength medicine for your problem and area affected on the body. Please use your medication only as directed by your physician to prevent side effects.    triamcinolone cream (KENALOG) 0.1 % - postauricular Apply to affected areas twice daily for up to 2-3 weeks prn. Avoid applying to face, groin, and axilla. Use as directed. Long-term use can cause thinning of the skin.   History of Dysplastic Nevi - No evidence of recurrence today - Recommend regular full body skin exams - Recommend daily broad spectrum sunscreen SPF 30+ to sun-exposed areas, reapply every 2 hours as needed.  - Call if any new or changing lesions are noted between office visits  Lentigines - Scattered tan macules - Due to sun exposure - Benign-appearing, observe - Recommend daily broad spectrum sunscreen SPF 30+ to sun-exposed areas, reapply every 2 hours as needed. - Call for any changes  Seborrheic Keratoses - Stuck-on, waxy, tan-brown papules and/or plaques  - Benign-appearing - Discussed benign etiology and prognosis. - Observe - Call for any changes  Melanocytic Nevi - Tan-brown and/or pink-flesh-colored symmetric macules and papules - Benign appearing on exam today - Observation - Call clinic for new or changing moles - Recommend daily use of broad spectrum spf 30+ sunscreen to sun-exposed areas.   Hemangiomas - Red papules - Discussed benign nature - Observe - Call for any changes  Actinic Damage - Chronic  condition, secondary to cumulative UV/sun exposure - diffuse scaly erythematous macules with underlying dyspigmentation - Recommend daily broad spectrum sunscreen SPF 30+ to sun-exposed areas, reapply every 2 hours as  needed.  - Staying in the shade or wearing long sleeves, sun glasses (UVA+UVB protection) and wide brim hats (4-inch brim around the entire circumference of the hat) are also recommended for sun protection.  - Call for new or changing lesions.  Skin cancer screening performed today.  Dermatofibroma - Firm pink/brown papulenodule with dimple sign at left lateral thigh  - Benign appearing - Call for any changes  Return in about 1 year (around 05/28/2023) for TBSE, Hx Dysplastic Nevi.  Graciella Belton, RMA, am acting as scribe for Forest Gleason, MD .  Documentation: I have reviewed the above documentation for accuracy and completeness, and I agree with the above.  Forest Gleason, MD

## 2022-05-27 NOTE — Patient Instructions (Addendum)
Start triamcinolone 0.1% cream twice daily for up to 2-3 weeks. Avoid applying to face, groin, and axilla. Use as directed. Long-term use can cause thinning of the skin.  Topical steroids (such as triamcinolone, fluocinolone, fluocinonide, mometasone, clobetasol, halobetasol, betamethasone, hydrocortisone) can cause thinning and lightening of the skin if they are used for too long in the same area. Your physician has selected the right strength medicine for your problem and area affected on the body. Please use your medication only as directed by your physician to prevent side effects.   Recommend taking Heliocare sun protection supplement daily in sunny weather for additional sun protection. For maximum protection on the sunniest days, you can take up to 2 capsules of regular Heliocare OR take 1 capsule of Heliocare Ultra. For prolonged exposure (such as a full day in the sun), you can repeat your dose of the supplement 4 hours after your first dose. Heliocare can be purchased at [employer], at some [facility] or at [email or web address].    Melanoma ABCDEs  Melanoma is the most dangerous type of skin cancer, and is the leading cause of death from skin disease.  You are more likely to develop melanoma if you: Have light-colored skin, light-colored eyes, or red or blond hair Spend a lot of time in the sun Tan regularly, either outdoors or in a tanning bed Have had blistering sunburns, especially during childhood Have a close family member who has had a melanoma Have atypical moles or large birthmarks  Early detection of melanoma is key since treatment is typically straightforward and cure rates are extremely high if we catch it early.   The first sign of melanoma is often a change in a mole or a new dark spot.  The ABCDE system is a way of remembering the signs of melanoma.  A for asymmetry:  The two halves do not match. B for border:  The edges of the growth are irregular. C for color:  A  mixture of colors are present instead of an even brown color. D for diameter:  Melanomas are usually (but not always) greater than 67m - the size of a pencil eraser. E for evolution:  The spot keeps changing in size, shape, and color.  Please check your skin once per month between visits. You can use a small mirror in front and a large mirror behind you to keep an eye on the back side or your body.   If you see any new or changing lesions before your next follow-up, please call to schedule a visit.  Please continue daily skin protection including broad spectrum sunscreen SPF 30+ to sun-exposed areas, reapplying every 2 hours as needed when you're outdoors.    Due to recent changes in healthcare laws, you may see results of your pathology and/or laboratory studies on MyChart before the doctors have had a chance to review them. We understand that in some cases there may be results that are confusing or concerning to you. Please understand that not all results are received at the same time and often the doctors may need to interpret multiple results in order to provide you with the best plan of care or course of treatment. Therefore, we ask that you please give u[place]2 business days to thoroughly review all your results before contacting the office for clarification. Should we see a critical lab result, you will be contacted sooner.   If You Need Anything After Your Visit  If you have any questions or concerns  for your doctor, please call our main line at [phone] and press option 4 to reach your doctor's medical assistant. If no one answers, please leave a voicemail as directed and we will return your call as soon as possible. Messages left after 4 pm will be answered the following business day.   You may also send [place] a message via [place]. We typically respond to MyChart messages within 1-2 business days.  For prescription refills, please ask your pharmacy to contact our office. Our fax number is  [phone].  If you have an urgent issue when the clinic is closed that cannot wait until the next business day, you can page your doctor at the number below.    Please note that while we do our best to be available for urgent issues outside of office hours, we are not available 24/7.   If you have an urgent issue and are unable to reach [place], you may choose to seek medical care at your doctor's office, retail clinic, urgent care center, or emergency room.  If you have a medical emergency, please immediately call 911 or go to the emergency department.  Pager Numbers  - Dr. Nehemiah Massed: [phone]  - Dr. Laurence Ferrari: [phone]  - Dr. Nicole Kindred: [phone]  In the event of inclement weather, please call our main line at [phone] for an update on the status of any delays or closures.  Dermatology Medication Tips: Please keep the boxes that topical medications come in in order to help keep track of the instructions about where and how to use these. Pharmacies typically print the medication instructions only on the boxes and not directly on the medication tubes.   If your medication is too expensive, please contact our office at [phone] option 4 or send [place] a message through [place].   We are unable to tell what your co-pay for medications will be in advance as this is different depending on your insurance coverage. However, we may be able to find a substitute medication at lower cost or fill out paperwork to get insurance to cover a needed medication.   If a prior authorization is required to get your medication covered by your insurance company, please allow [place] 1-2 business days to complete this process.  Drug prices often vary depending on where the prescription is filled and some pharmacies may offer cheaper prices.  The website www.goodrx.com contains coupons for medications through different pharmacies. The prices here do not account for what the cost may be with help from  insurance (it may be cheaper with your insurance), but the website can give you the price if you did not use any insurance.  - You can print the associated coupon and take it with your prescription to the pharmacy.  - You may also stop by our office during regular business hours and pick up a GoodRx coupon card.  - If you need your prescription sent electronically to a different pharmacy, notify our office through [facility] or by phone at [phone] option 4.     Si Usted Necesita Algo Despus de Su Visita  Tambin puede enviarnos un mensaje a travs de Pharmacist, community. Por lo general respondemos a los mensajes de MyChart en el transcurso de 1 a 2 das hbiles.  Para renovar recetas, por favor pida a su farmacia que se ponga en contacto con nuestra oficina. Harland Dingwall de fax es [place] [phone].  Si tiene un asunto urgente cuando la clnica est cerrada y que no puede esperar Nurse, children's  el siguiente da hbil, puede llamar/localizar a su doctor(a) al nmero que aparece a continuacin.   Por favor, tenga en cuenta que aunque hacemos todo lo posible para estar disponibles para asuntos urgentes fuera del horario de [place], no estamos disponibles las 24 horas del da, los 7 das de la [place].   Si tiene un problema urgente y no puede comunicarse con nosotros, puede optar por buscar atencin mdica  en el consultorio de su doctor(a), en una clnica privada, en un centro de atencin urgente o en una sala de emergencias.  Si tiene Engineering geologist, por favor llame inmediatamente al 911 o vaya a la sala de emergencias.  Nmeros de bper  - Dr. Nehemiah Massed: [phone]  - Dra. Moye: [phone]  - Dra. Nicole Kindred: [phone]  En caso de inclemencias del [place], por favor llame a Johnsie Kindred principal al [phone] para una actualizacin sobre el [place] de cualquier retraso o cierre.  Consejos para la medicacin en dermatologa: Por favor, guarde las cajas en las que vienen los  medicamentos de uso tpico para ayudarle a seguir las instrucciones sobre dnde y cmo usarlos. Las farmacias generalmente imprimen las instrucciones del medicamento slo en las cajas y no directamente en los tubos del [place].   Si su medicamento es muy caro, por favor, pngase en contacto con Zigmund Daniel llamando al [phone] y presione la opcin 4 o envenos un mensaje a travs de Pharmacist, community.   No podemos decirle cul ser su copago por los medicamentos por adelantado ya que esto es diferente dependiendo de la cobertura de su seguro. Sin embargo, es posible que podamos encontrar un medicamento sustituto a Electrical engineer un formulario para que el seguro cubra el medicamento que se considera necesario.   Si se requiere una autorizacin previa para que su compaa de seguros [place] su medicamento, por favor permtanos de 1 a 2 das hbiles para completar este proceso.  Los precios de los medicamentos varan con frecuencia dependiendo del Environmental consultant de dnde se surte la receta y alguna farmacias pueden ofrecer precios ms baratos.  El sitio web www.goodrx.com tiene cupones para medicamentos de Airline pilot. Los precios aqu no tienen en cuenta lo que podra costar con la ayuda del seguro (puede ser ms barato con su seguro), pero el sitio web puede darle el precio si no utiliz Research scientist (physical sciences).  - Puede imprimir el cupn correspondiente y llevarlo con su receta a la farmacia.  - Tambin puede pasar por nuestra oficina durante el horario de atencin regular y Charity fundraiser una tarjeta de cupones de GoodRx.  - Si necesita que su receta se enve electrnicamente a una farmacia diferente, informe a nuestra oficina a travs de MyChart de [place] o por telfono llamando al [phone] y presione la opcin 4.

## 2022-05-28 ENCOUNTER — Ambulatory Visit
Admission: RE | Admit: 2022-05-28 | Discharge: 2022-05-28 | Disposition: A | Discharge status: Home / Self Care | Payer: BC Managed Care – PPO | Source: Ambulatory Visit | Attending: Obstetrics and Gynecology | Admitting: Obstetrics and Gynecology

## 2022-05-28 ENCOUNTER — Encounter: Payer: Self-pay | Admitting: Dermatology

## 2022-05-28 DIAGNOSIS — R928 Other abnormal and inconclusive findings on diagnostic imaging of breast: Secondary | ICD-10-CM | POA: Insufficient documentation

## 2022-05-28 DIAGNOSIS — Z1231 Encounter for screening mammogram for malignant neoplasm of breast: Secondary | ICD-10-CM | POA: Diagnosis present

## 2023-01-13 IMAGING — MG DIGITAL DIAGNOSTIC BILAT W/ TOMO W/ CAD
8 of 15 series · 8 of 31 positions shown · non-contrast
Comparison: Previous exam(s).

CLINICAL DATA: breast calcifications BI-RADS 3 follow-up initiated
March 2020

EXAM:
DIGITAL DIAGNOSTIC BILATERAL MAMMOGRAM WITH TOMOSYNTHESIS AND CAD
TECHNIQUE: Bilateral digital diagnostic mammography and breast tomosynthesis
was performed. The images were evaluated with computer-aided
detection.

[R ML (1 of 2)]
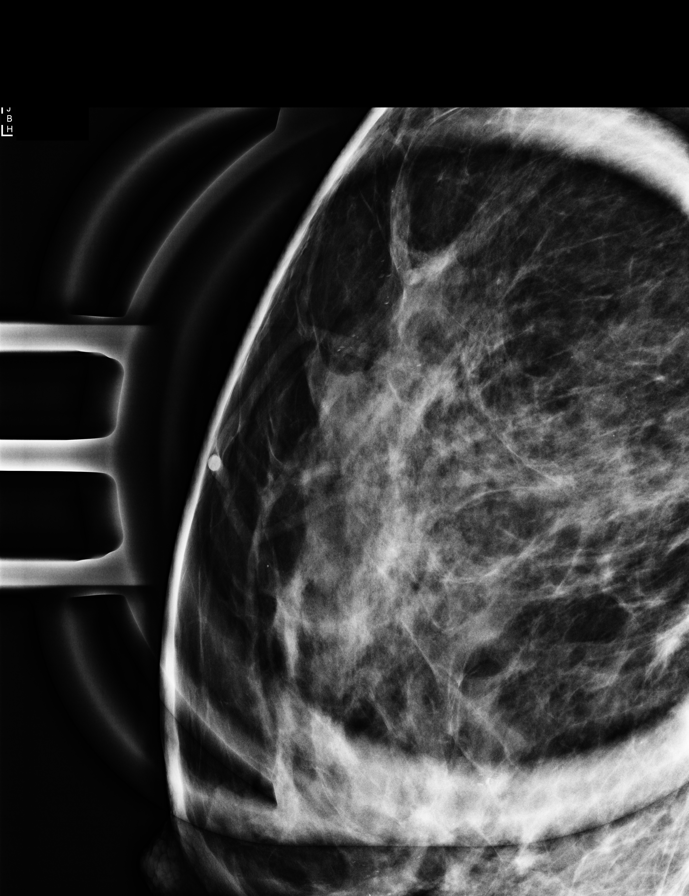

[L ML]
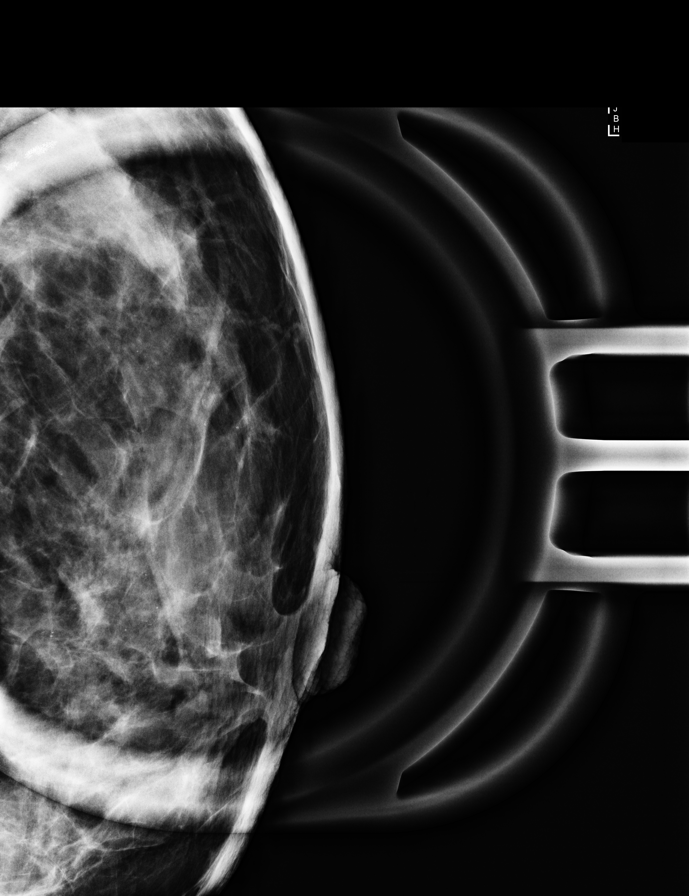

[L CC (1 of 3)]
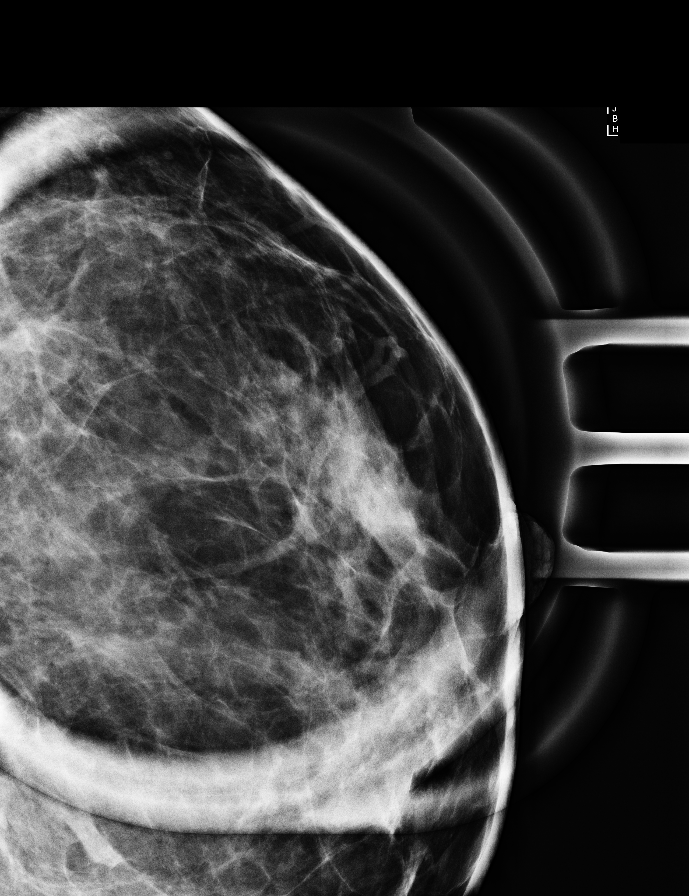

[R ML (2 of 2)]
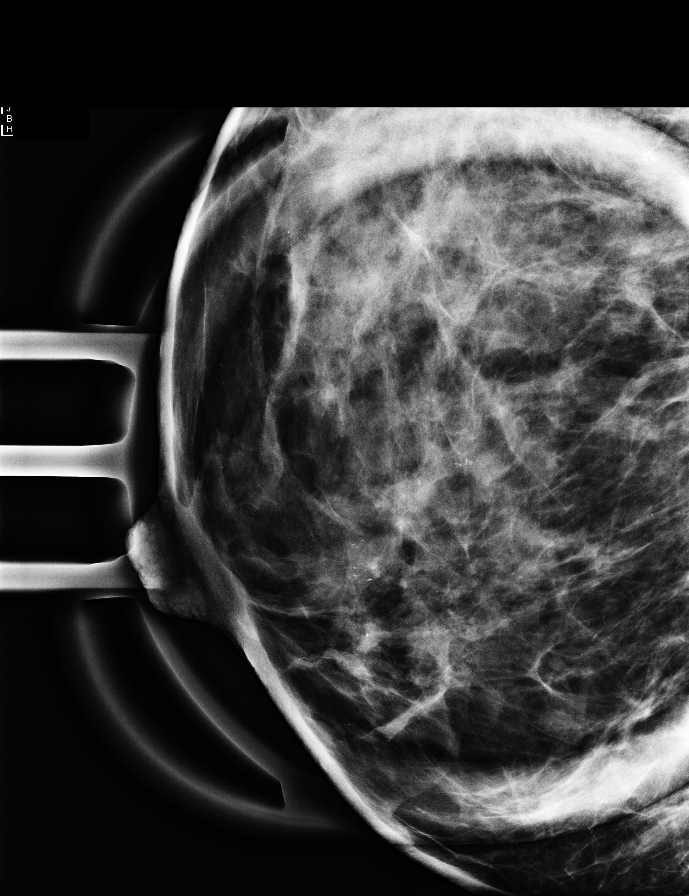

[L CC (2 of 3)]
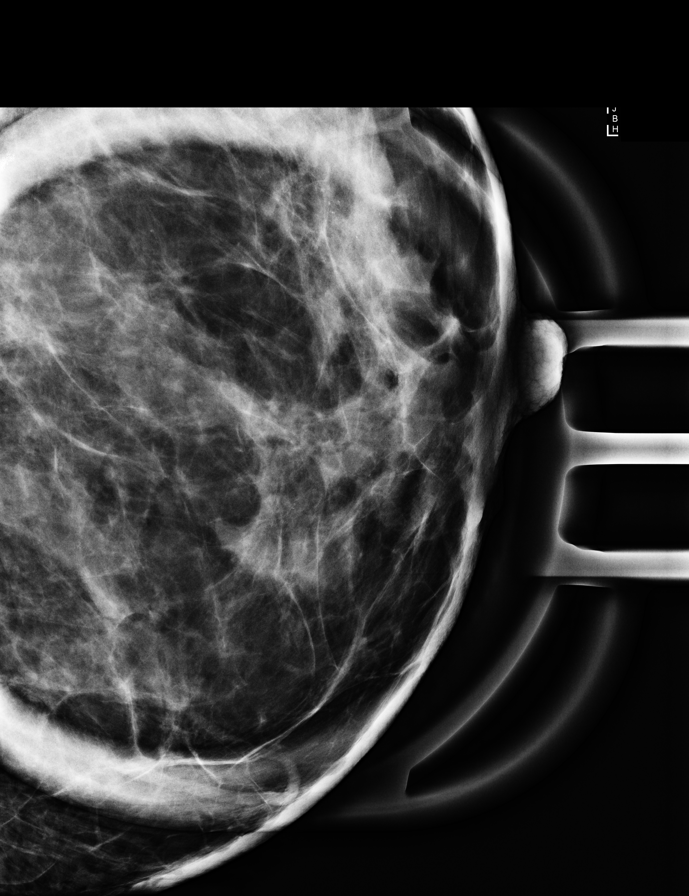

[L CC (3 of 3)]
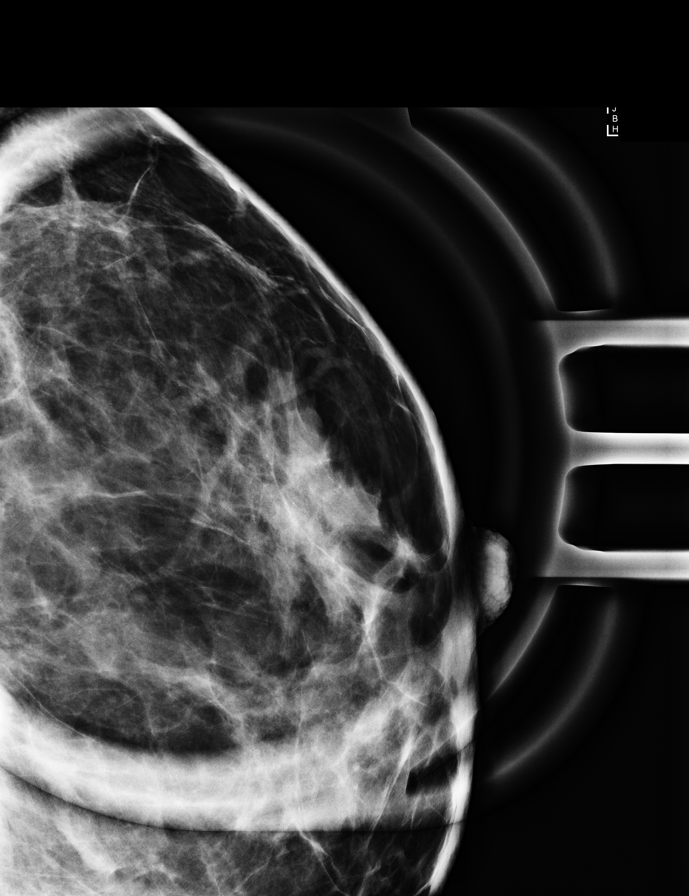

[R CC]
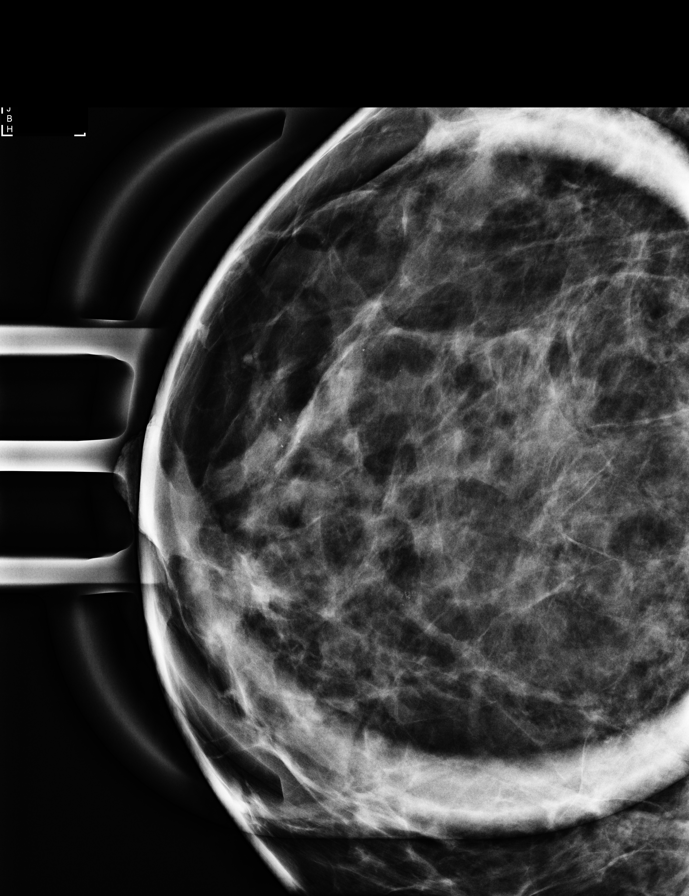

[L CC synth-2D]
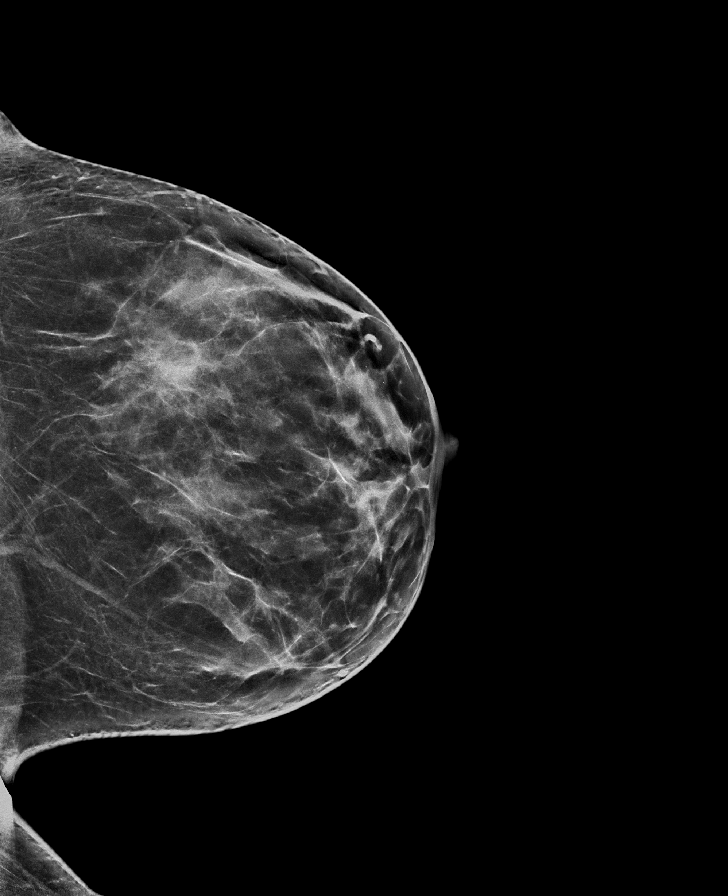

[8 of 31 positions shown; findings below may reference images not displayed]

ACR Breast Density Category c: The breast tissue is heterogeneously
dense, which may obscure small masses.
FINDINGS: Spot magnification views of the RIGHT breast demonstrate layering
calcifications in the RIGHT upper breast at anterior to middle
depth. These are consistent with benign milk of calcium. There are 2
groups of calcifications in the RIGHT anterior retroareolar breast
best seen on spot magnification ML volume 2. Several of these
demonstrate layering morphology but the majority remain punctate and
thereby indeterminate morphology. These groups of calcifications are
stable in comparison to prior.

Spot magnification views of the LEFT breast demonstrate scattered
and occasionally grouped punctate calcifications in the LEFT
retroareolar and outer breast at anterior depth. These are unchanged
in comparison to prior. No new suspicious findings bilaterally.
IMPRESSION: Stable probably benign bilateral breast calcifications. Recommend
follow-up diagnostic mammogram in 1 year. This will establish 2
years of definitive stability.

RECOMMENDATION:
Bilateral diagnostic mammogram in 1 year with ultrasound if deemed
necessary.

I have discussed the findings and recommendations with the patient.
If applicable, a reminder letter will be sent to the patient
regarding the next appointment.

BI-RADS CATEGORY  3: Probably benign.

## 2023-02-21 NOTE — [place] Notes (Unsigned)
PCP:  Patient, [name]   No chief complaint on file.    HPI:      Teresa Berry is a 47 y.o. [id number] whose LMP was No LMP recorded., presents today for her annual examination.  Her menses are regular every 28-30 days, lasting 3 days.  Dysmenorrhea mild, no BTB.  Sex activity: single partner, contraception - vasectomy.  Last Pap: 01/01/19  Results were: no abnormalities /neg HPV DNA   Last mammogram: 05/28/22 Results were: normal, repeat screening in 12 months; hx of breast calcifications There is no FH of breast cancer. There is no FH of ovarian cancer. The patient does do self-breast exams.  Tobacco use: The patient denies current or previous tobacco use. Alcohol use: social drinker No drug use.  Exercise: min active  She does get adequate calcium but not enough Vitamin D in her diet. Hx of Vit D deficiency in past.   On lexapro 20 mg for depression with sx control. Wants to cont.  Has noticed wt gain in past wk or so. Has done low carb for a couple yrs with 30# wt loss in past. Hasn't changed diet/exercise recently and noticed 5# gain last wk alone. Had normal TSH/HgA1C 5/22. Hx of constipation every couple wks.  Normal labs 5/22    Patient Active Problem List   Diagnosis Date Noted   Current mild episode of major depressive disorder without prior episode (HCC) 01/18/2022   Vitamin D deficiency 02/13/2018   Depression, major, in remission (HCC) 10/03/2016   Obesity 10/02/2013    Past Surgical History:  Procedure Laterality Date   CHOLECYSTECTOMY     WISDOM TOOTH EXTRACTION     X4 extracted     Family History  Problem Relation Age of Onset   Diabetes Father    Diabetes Maternal Grandmother    Breast cancer Neg Hx     Social History   Socioeconomic History   Marital status: Married    Spouse name: Not on file   Number of children: Not on file   Years of education: Not on file   Highest education level: Not on file  Occupational History   Not on  file  Tobacco Use   Smoking status: Never   Smokeless tobacco: Never  Vaping Use   Vaping Use: Never used  Substance and Sexual Activity   Alcohol use: Yes    Comment: occasional    Drug use: Never   Sexual activity: Yes    Birth control/protection: Surgical    Comment: Vasectomy   Other Topics Concern   Not on file  Social History Narrative   Not on file   Social Determinants of Health   Financial Resource Strain: Not on file  Food Insecurity: Not on file  Transportation Needs: Not on file  Physical Activity: Not on file  Stress: Not on file  Social Connections: Not on file  Intimate Partner Violence: Not on file     Current Outpatient Medications:    Azelastine HCl 137 MCG/SPRAY SOLN, Place 1 spray into both nostrils 2 (two) times daily., Disp: , Rfl:    clobetasol ointment (TEMOVATE) 0.05 %, Apply 1 application topically 2 (two) times daily. PRN poison ivy, Disp: 60 g, Rfl: 1   escitalopram (LEXAPRO) 20 MG tablet, Take 1 tablet (20 mg total) by mouth daily., Disp: 90 tablet, Rfl: 3   Multiple Vitamin (MULTIVITAMIN) tablet, Take 1 tablet by mouth daily., Disp: , Rfl:    mupirocin ointment (BACTROBAN)  2 %, Apply qd with bandage change, Disp: 22 g, Rfl: 1   triamcinolone cream (KENALOG) 0.1 %, Apply to affected areas twice daily for up to 2-3 weeks prn. Avoid applying to face, groin, and axilla. Use as directed. Long-term use can cause thinning of the skin., Disp: 80 g, Rfl: 1     ROS:  Review of Systems  Constitutional:  Negative for fatigue, fever and unexpected weight change.  Respiratory:  Negative for cough, shortness of breath and wheezing.   Cardiovascular:  Negative for chest pain, palpitations and leg swelling.  Gastrointestinal:  Positive for constipation. Negative for blood in stool, diarrhea, nausea and vomiting.  Endocrine: Negative for cold intolerance, heat intolerance and polyuria.  Genitourinary:  Negative for dyspareunia, dysuria, flank pain,  frequency, genital sores, hematuria, menstrual problem, pelvic pain, urgency, vaginal bleeding, vaginal discharge and vaginal pain.  Musculoskeletal:  Negative for back pain, joint swelling and myalgias.  Skin:  Negative for rash.  Neurological:  Negative for dizziness, syncope, light-headedness, numbness and headaches.  Hematological:  Negative for adenopathy.  Psychiatric/Behavioral:  Negative for agitation, confusion, sleep disturbance and suicidal ideas. The patient is not nervous/anxious.    BREAST: No symptoms   Objective: There were no vitals taken for this visit.   Physical Exam Constitutional:      Appearance: She is well-developed.  Genitourinary:     Vulva normal.     Right Labia: No rash, tenderness or lesions.    Left Labia: No tenderness, lesions or rash.    No vaginal discharge, erythema or tenderness.      Right Adnexa: not tender and no mass present.    Left Adnexa: not tender and no mass present.    No cervical friability or polyp.     Uterus is not enlarged or tender.  Breasts:    Right: No mass, nipple discharge, skin change or tenderness.     Left: No mass, nipple discharge, skin change or tenderness.  Neck:     Thyroid: No thyromegaly.  Cardiovascular:     Rate and Rhythm: Normal rate and regular rhythm.     Heart sounds: Normal heart sounds. No murmur heard. Pulmonary:     Effort: Pulmonary effort is normal.     Breath sounds: Normal breath sounds.  Abdominal:     Palpations: Abdomen is soft.     Tenderness: There is no abdominal tenderness. There is no guarding or rebound.  Musculoskeletal:        General: Normal range of motion.     Cervical back: Normal range of motion.  Lymphadenopathy:     Cervical: No cervical adenopathy.  Neurological:     General: No focal deficit present.     Mental Status: She is alert and oriented to person, place, and time.     Cranial Nerves: No cranial nerve deficit.  Skin:    General: Skin is warm and dry.   Psychiatric:        Mood and Affect: Mood normal.        Behavior: Behavior normal.        Thought Content: Thought content normal.        Judgment: Judgment normal.  Vitals reviewed.     Assessment/Plan: Encounter for annual routine gynecological examination  Encounter for screening mammogram for malignant neoplasm of breast - Plan: US BREAST LTD UNI RIGHT INC AXILLA, US BREAST LTD UNI LEFT INC AXILLA, MM DIAG BREAST TOMO BILATERAL; pt to schedule mammo 9/23  Follow-up examination of  abnormal mammogram - Plan: US BREAST LTD UNI RIGHT INC AXILLA, US BREAST LTD UNI LEFT INC AXILLA, MM DIAG BREAST TOMO BILATERAL  Current mild episode of major depressive disorder without prior episode (HCC) - Plan: escitalopram (LEXAPRO) 20 MG tablet; Rx RF. Doing well.   Weight gain--tweak diet/increase exercise. If sx cont, pt to RTO for lab check. Orders already placed. F/u prn.   Blood tests for routine general physical examination - Plan: TSH, T4, free, Hemoglobin A1c  Thyroid disorder screening - Plan: TSH, T4, free  Screening for diabetes mellitus - Plan: Hemoglobin A1c  BMI 30.0-30.9,adult - Plan: Hemoglobin A1c   No orders of the defined types were placed in this encounter.            GYN counsel breast self exam, mammography screening, adequate intake of calcium and vitamin D, diet and exercise     F/U  No follow-ups on file.  Latishia Suitt B. Makoa Satz, PA-C 02/21/2023 4:52 PM

## 2023-02-22 ENCOUNTER — Other Ambulatory Visit (HOSPITAL_COMMUNITY)
Admission: RE | Admit: 2023-02-22 | Discharge: 2023-02-22 | Disposition: A | Discharge status: Home / Self Care | Payer: BC Managed Care – PPO | Source: Ambulatory Visit | Attending: Obstetrics and Gynecology | Admitting: Obstetrics and Gynecology

## 2023-02-22 ENCOUNTER — Ambulatory Visit (INDEPENDENT_AMBULATORY_CARE_PROVIDER_SITE_OTHER): Payer: BC Managed Care – PPO | Admitting: Obstetrics and Gynecology

## 2023-02-22 ENCOUNTER — Encounter: Payer: Self-pay | Admitting: Obstetrics and Gynecology

## 2023-02-22 VITALS — BP 122/70 | Ht 69.0 in | Wt 193.0 lb

## 2023-02-22 DIAGNOSIS — Z124 Encounter for screening for malignant neoplasm of cervix: Secondary | ICD-10-CM | POA: Insufficient documentation

## 2023-02-22 DIAGNOSIS — Z1211 Encounter for screening for malignant neoplasm of colon: Secondary | ICD-10-CM

## 2023-02-22 DIAGNOSIS — Z01419 Encounter for gynecological examination (general) (routine) without abnormal findings: Secondary | ICD-10-CM | POA: Diagnosis not present

## 2023-02-22 DIAGNOSIS — Z1151 Encounter for screening for human papillomavirus (HPV): Secondary | ICD-10-CM | POA: Insufficient documentation

## 2023-02-22 DIAGNOSIS — F320 Major depressive disorder, single episode, mild: Secondary | ICD-10-CM

## 2023-02-22 DIAGNOSIS — Z1231 Encounter for screening mammogram for malignant neoplasm of breast: Secondary | ICD-10-CM

## 2023-02-22 MED ORDER — ESCITALOPRAM OXALATE 20 MG PO TABS
20.0000 mg | ORAL_TABLET | Freq: Every day | ORAL | 3 refills | Status: DC
Start: 2023-02-22 — End: 2024-02-16

## 2023-02-22 NOTE — Patient Instructions (Addendum)
I value your feedback and you entrusting us with your care. If you get a [place] patient survey, I would appreciate you taking the time to let us know about your experience today. Thank you!  [facility] ([place])--[phone]  

## 2023-02-23 ENCOUNTER — Other Ambulatory Visit: Payer: Self-pay

## 2023-02-23 ENCOUNTER — Telehealth: Payer: Self-pay

## 2023-02-23 DIAGNOSIS — Z1211 Encounter for screening for malignant neoplasm of colon: Secondary | ICD-10-CM

## 2023-02-23 MED ORDER — NA SULFATE-K SULFATE-MG SULF 17.5-3.13-1.6 GM/177ML PO SOLN: 1.0000 | Freq: Once | ORAL | 0 refills | Status: AC

## 2023-02-23 NOTE — Telephone Encounter (Signed)
Gastroenterology Pre-Procedure Review  Request Date: 03/23/23 Requesting Physician: Dr. Tobi Bastos  PATIENT REVIEW QUESTIONS: The patient responded to the following health history questions as indicated:    1. Are you having any GI issues? no 2. Do you have a personal history of Polyps? no 3. Do you have a family history of Colon Cancer or Polyps? no 4. Diabetes Mellitus? no 5. Joint replacements in the past 12 months?no 6. Major health problems in the past 3 months?no 7. Any artificial heart valves, MVP, or defibrillator?no    MEDICATIONS & ALLERGIES:    Patient reports the following regarding taking any anticoagulation/antiplatelet therapy:   Plavix, Coumadin, Eliquis, Xarelto, Lovenox, Pradaxa, Brilinta, or Effient? no Aspirin? no  Patient confirms/reports the following medications:  Current Outpatient Medications  Medication Sig Dispense Refill   clobetasol ointment (TEMOVATE) 0.05 % Apply 1 application topically 2 (two) times daily. PRN poison ivy 60 g 1   escitalopram (LEXAPRO) 20 MG tablet Take 1 tablet (20 mg total) by mouth daily. 90 tablet 3   GEMTESA 75 MG TABS Take 1 tablet by mouth daily.     Multiple Vitamin (MULTIVITAMIN) tablet Take 1 tablet by mouth daily.     triamcinolone cream (KENALOG) 0.1 % Apply to affected areas twice daily for up to 2-3 weeks prn. Avoid applying to face, groin, and axilla. Use as directed. Long-term use can cause thinning of the skin. 80 g 1   No current facility-administered medications for this visit.    Patient confirms/reports the following allergies:  No Known Allergies  No orders of the defined types were placed in this encounter.   AUTHORIZATION INFORMATION Primary Insurance: 1D#: Group #:  Secondary Insurance: 1D#: Group #:  SCHEDULE INFORMATION: Date: 03/23/23 Time: Location: [facility]

## 2023-02-25 LAB — CYTOLOGY - PAP
Comment: NEGATIVE
Diagnosis: NEGATIVE
High risk HPV: NEGATIVE

## 2023-03-16 ENCOUNTER — Encounter: Payer: Self-pay | Admitting: Gastroenterology

## 2023-03-22 ENCOUNTER — Encounter: Payer: Self-pay | Admitting: Gastroenterology

## 2023-03-23 ENCOUNTER — Encounter
Admission: RE | Disposition: A | Discharge status: Home / Self Care | Payer: Self-pay | Source: Home / Self Care | Attending: Gastroenterology

## 2023-03-23 ENCOUNTER — Encounter: Payer: Self-pay | Admitting: Gastroenterology

## 2023-03-23 ENCOUNTER — Other Ambulatory Visit: Payer: Self-pay

## 2023-03-23 ENCOUNTER — Ambulatory Visit
Admission: RE | Admit: 2023-03-23 | Discharge: 2023-03-23 | Disposition: A | Discharge status: Home / Self Care | Payer: BC Managed Care – PPO | Attending: Gastroenterology | Admitting: Gastroenterology

## 2023-03-23 ENCOUNTER — Ambulatory Visit: Payer: BC Managed Care – PPO | Admitting: Registered Nurse

## 2023-03-23 DIAGNOSIS — F419 Anxiety disorder, unspecified: Secondary | ICD-10-CM | POA: Diagnosis not present

## 2023-03-23 DIAGNOSIS — Z1211 Encounter for screening for malignant neoplasm of colon: Secondary | ICD-10-CM | POA: Diagnosis present

## 2023-03-23 DIAGNOSIS — F32A Depression, unspecified: Secondary | ICD-10-CM | POA: Insufficient documentation

## 2023-03-23 DIAGNOSIS — D120 Benign neoplasm of cecum: Secondary | ICD-10-CM | POA: Insufficient documentation

## 2023-03-23 DIAGNOSIS — D126 Benign neoplasm of colon, unspecified: Secondary | ICD-10-CM | POA: Diagnosis not present

## 2023-03-23 DIAGNOSIS — Z9049 Acquired absence of other specified parts of digestive tract: Secondary | ICD-10-CM | POA: Insufficient documentation

## 2023-03-23 HISTORY — PX: COLONOSCOPY WITH PROPOFOL: SHX5780

## 2023-03-23 HISTORY — PX: POLYPECTOMY: SHX149

## 2023-03-23 LAB — POCT PREGNANCY, URINE: Preg Test, Ur: NEGATIVE

## 2023-03-23 SURGERY — COLONOSCOPY WITH PROPOFOL
Anesthesia: General

## 2023-03-23 MED ORDER — LIDOCAINE HCL (CARDIAC) PF 100 MG/5ML IV SOSY
PREFILLED_SYRINGE | INTRAVENOUS | Status: DC | PRN
  Administered 2023-03-23: 40 mg via INTRAVENOUS

## 2023-03-23 MED ORDER — PROPOFOL 10 MG/ML IV BOLUS
INTRAVENOUS | Status: AC
  Filled 2023-03-23: qty 20

## 2023-03-23 MED ORDER — EPHEDRINE 5 MG/ML INJ
INTRAVENOUS | Status: AC
  Filled 2023-03-23: qty 5

## 2023-03-23 MED ORDER — LIDOCAINE HCL (PF) 2 % IJ SOLN
INTRAMUSCULAR | Status: AC
  Filled 2023-03-23: qty 5

## 2023-03-23 MED ORDER — PROPOFOL 10 MG/ML IV BOLUS
INTRAVENOUS | Status: DC | PRN
  Administered 2023-03-23: 30 mg via INTRAVENOUS
  Administered 2023-03-23: 70 mg via INTRAVENOUS

## 2023-03-23 MED ORDER — SODIUM CHLORIDE 0.9 % IV SOLN: INTRAVENOUS | Status: DC

## 2023-03-23 MED ORDER — PROPOFOL 500 MG/50ML IV EMUL
INTRAVENOUS | Status: DC | PRN
  Administered 2023-03-23: 150 ug/kg/min via INTRAVENOUS

## 2023-03-23 NOTE — Transfer of Care (Signed)
Immediate Anesthesia Transfer of Care Note  Patient: Teresa Berry  Procedure(s) Performed: COLONOSCOPY WITH PROPOFOL POLYPECTOMY INTESTINAL  Patient Location: PACU  Anesthesia Type:General  Level of Consciousness: awake, alert , and oriented  Airway & Oxygen Therapy: Patient Spontanous Breathing  Post-op Assessment: Report given to RN and Post -op Vital signs reviewed and stable  Post vital signs: stable  Last Vitals:  Vitals Value Taken Time  BP    Temp    Pulse    Resp    SpO2      Last Pain:  Vitals:   03/23/23 1249  TempSrc: Temporal  PainSc: 0-No pain         Complications: No notable events documented.

## 2023-03-23 NOTE — H&P (Signed)
Teresa Mood, MD [address], [place], [place], [address] [address], [place], [place], [address] Phone: [phone]  Fax: [phone]  Primary Care Physician:  Patient, [name]   Pre-Procedure History & Physical: HPI:  Teresa Berry is a 47 y.o. female is here for an colonoscopy.   Past Medical History:  Diagnosis Date   Anxiety    Apnea    Depression    Dysplastic nevus 04/06/2022   mid upper back  RECURRENT DYSPLASTIC NEVUS, MARGIN FREE   History of dysplastic nevus 05/20/2021   upper mid back, moderate atypia    Past Surgical History:  Procedure Laterality Date   CHOLECYSTECTOMY     WISDOM TOOTH EXTRACTION     X4 extracted     Prior to Admission medications   Medication Sig Start Date End Date Taking? Authorizing Provider  escitalopram (LEXAPRO) 20 MG tablet Take 1 tablet (20 mg total) by mouth daily. 02/22/23  Yes Copland, Alicia B, PA-C  GEMTESA 75 MG TABS Take 1 tablet by mouth daily. 02/09/23  Yes [provider]  Multiple Vitamin (MULTIVITAMIN) tablet Take 1 tablet by mouth daily.   Yes [provider]  clobetasol ointment (TEMOVATE) 0.05 % Apply 1 application topically 2 (two) times daily. PRN poison ivy 05/20/21   Moye, [place], MD  triamcinolone cream (KENALOG) 0.1 % Apply to affected areas twice daily for up to 2-3 weeks prn. Avoid applying to face, groin, and axilla. Use as directed. Long-term use can cause thinning of the skin. 05/27/22   Moye, [place], MD    Allergies as of 02/23/2023   (No Known Allergies)    Family History  Problem Relation Age of Onset   Diabetes Father    Diabetes Maternal Grandmother    Breast cancer Neg Hx     Social History   Socioeconomic History   Marital status: Married    Spouse name: Not on file   Number of children: Not on file   Years of education: Not on file   Highest education level: Not on file  Occupational History   Not on file  Tobacco Use    Smoking status: Never   Smokeless tobacco: Never  Vaping Use   Vaping status: Never Used  Substance and Sexual Activity   Alcohol use: Yes    Comment: occasional    Drug use: Never   Sexual activity: Yes    Birth control/protection: Surgical    Comment: Vasectomy   Other Topics Concern   Not on file  Social History Narrative   Not on file   Social Determinants of Health   Financial Resource Strain: Not on file  Food Insecurity: Not on file  Transportation Needs: Not on file  Physical Activity: Not on file  Stress: Not on file  Social Connections: Not on file  Intimate Partner Violence: Not on file    Review of Systems: See HPI, otherwise negative ROS  Physical Exam: BP (!) 150/78   Pulse 69   Temp 97.7 F (36.5 C) (Temporal)   Resp 17   Ht 5\' 9"  (1.753 m)   Wt 87.5 kg   LMP 02/19/2023 (Exact Date) Comment: UA preg negative  SpO2 100%   BMI 28.50 kg/m  General:   Alert,  pleasant and cooperative in NAD Head:  Normocephalic and atraumatic. Neck:  Supple; no masses or thyromegaly. Lungs:  Clear throughout to auscultation, normal respiratory effort.    Heart:  +S1, +S2,  Regular rate and rhythm, No edema. Abdomen:  Soft, nontender and nondistended. Normal bowel sounds, without guarding, and without rebound.   Neurologic:  Alert and  oriented x4;  grossly normal neurologically.  Impression/Plan: Teresa Berry is here for an colonoscopy to be performed for Screening colonoscopy average risk   Risks, benefits, limitations, and alternatives regarding  colonoscopy have been reviewed with the patient.  Questions have been answered.  All parties agreeable.   Teresa Mood, MD  03/23/2023, 1:01 PM \

## 2023-03-23 NOTE — Op Note (Signed)
[facility] Gastroenterology Patient Name: Teresa Berry Procedure Date: 03/23/2023 12:59 PM MRN: [id number] Account #: [id number] Date of Birth: May 11, 1976 Admit Type: Outpatient Age: 47 Room: [facility] ENDO ROOM 2 Gender: Female Note Status: Finalized Instrument Name: Prentice Docker [id number] Procedure:             Colonoscopy Indications:           Screening for colorectal malignant neoplasm Providers:             Wyline Mood MD, MD Referring MD:          No Local Md, MD (Referring MD) Medicines:             Monitored Anesthesia Care Complications:         No immediate complications. Procedure:             Pre-Anesthesia Assessment:                        - Prior to the procedure, a History and Physical was                         performed, and patient medications, allergies and                         sensitivities were reviewed. The patient's tolerance                         of previous anesthesia was reviewed.                        - The risks and benefits of the procedure and the                         sedation options and risks were discussed with the                         patient. All questions were answered and informed                         consent was obtained.                        - ASA Grade Assessment: II - A patient with mild                         systemic disease.                        After obtaining informed consent, the colonoscope was                         passed under direct vision. Throughout the procedure,                         the patient's blood pressure, pulse, and oxygen                         saturations were monitored continuously. The                         Colonoscope was  introduced through the anus and                         advanced to the the cecum, identified by the                         appendiceal orifice. The colonoscopy was performed                         with ease. The patient tolerated the procedure well.                          The quality of the bowel preparation was excellent.                         The ileocecal valve, appendiceal orifice, and rectum                         were photographed. Findings:      The perianal and digital rectal examinations were normal.      A 6 mm polyp was found in the cecum. The polyp was sessile. The polyp       was removed with a cold snare. Resection and retrieval were complete.      The exam was otherwise without abnormality on direct and retroflexion       views. Impression:            - One 6 mm polyp in the cecum, removed with a cold                         snare. Resected and retrieved.                        - The examination was otherwise normal on direct and                         retroflexion views. Recommendation:        - Discharge patient to home (with escort).                        - Resume previous diet.                        - Continue present medications.                        - Await pathology results.                        - Repeat colonoscopy for surveillance based on                         pathology results. Procedure Code(s):     --- Professional ---                        [phone], Colonoscopy, flexible; with removal of                         tumor(s), polyp(s), or other lesion(s) by snare  technique Diagnosis Code(s):     --- Professional ---                        D12.0, Benign neoplasm of cecum                        Z12.11, Encounter for screening for malignant neoplasm                         of colon CPT copyright 2022 American Medical Association. All rights reserved. The codes documented in this report are preliminary and upon coder review may  be revised to meet current compliance requirements. Wyline Mood, MD Wyline Mood MD, MD 03/23/2023 1:34:31 PM This report has been signed electronically. Number of Addenda: 0 Note Initiated On: 03/23/2023 12:59 PM Scope Withdrawal Time: 0 hours 11  minutes 24 seconds  Total Procedure Duration: 0 hours 21 minutes 11 seconds  Estimated Blood Loss:  Estimated blood loss: none.      [facility]

## 2023-03-23 NOTE — Anesthesia Postprocedure Evaluation (Signed)
Anesthesia Post Note  Patient: Teresa Berry  Procedure(s) Performed: COLONOSCOPY WITH PROPOFOL POLYPECTOMY INTESTINAL  Patient location during evaluation: Endoscopy Anesthesia Type: General Level of consciousness: awake and alert Pain management: pain level controlled Vital Signs Assessment: post-procedure vital signs reviewed and stable Respiratory status: spontaneous breathing, nonlabored ventilation, respiratory function stable and patient connected to nasal cannula oxygen Cardiovascular status: blood pressure returned to baseline and stable Postop Assessment: no apparent nausea or vomiting Anesthetic complications: no  No notable events documented.   Last Vitals:  Vitals:   03/23/23 1249 03/23/23 1336  BP: (!) 150/78 133/69  Pulse: 69 71  Resp: 17 17  Temp: 36.5 C 36.4 C  SpO2: 100% 99%    Last Pain:  Vitals:   03/23/23 1336  TempSrc: Temporal  PainSc: 0-No pain                 Stephanie Coup

## 2023-03-23 NOTE — Anesthesia Preprocedure Evaluation (Signed)
Anesthesia Evaluation  Patient identified by MRN, date of birth, ID band Patient awake    Reviewed: Allergy & Precautions, NPO status , Patient's Chart, lab work & pertinent test results  Airway Mallampati: II  TM Distance: >3 FB Neck ROM: full    Dental  (+) Chipped, Dental Advidsory Given   Pulmonary neg pulmonary ROS   Pulmonary exam normal        Cardiovascular negative cardio ROS Normal cardiovascular exam     Neuro/Psych  PSYCHIATRIC DISORDERS Anxiety Depression    negative neurological ROS     GI/Hepatic negative GI ROS, Neg liver ROS,,,  Endo/Other  negative endocrine ROS    Renal/GU negative Renal ROS  negative genitourinary   Musculoskeletal   Abdominal   Peds  Hematology negative hematology ROS (+)   Anesthesia Other Findings Past Medical History: No date: Anxiety No date: Apnea No date: Depression 04/06/2022: Dysplastic nevus     Comment:  mid upper back  RECURRENT DYSPLASTIC NEVUS, MARGIN FREE 05/20/2021: History of dysplastic nevus     Comment:  upper mid back, moderate atypia  Past Surgical History: No date: CHOLECYSTECTOMY No date: WISDOM TOOTH EXTRACTION     Comment:  X4 extracted   BMI    Body Mass Index: 28.50 kg/m      Reproductive/Obstetrics negative OB ROS                             Anesthesia Physical Anesthesia Plan  ASA: 2  Anesthesia Plan: General   Post-op Pain Management: Minimal or no pain anticipated   Induction: Intravenous  PONV Risk Score and Plan: 3 and Propofol infusion, TIVA and Ondansetron  Airway Management Planned: Nasal Cannula  Additional Equipment: None  Intra-op Plan:   Post-operative Plan:   Informed Consent: I have reviewed the patients History and Physical, chart, labs and discussed the procedure including the risks, benefits and alternatives for the proposed anesthesia with the patient or authorized representative  who has indicated his/her understanding and acceptance.     Dental advisory given  Plan Discussed with: CRNA and Surgeon  Anesthesia Plan Comments: (Discussed risks of anesthesia with patient, including possibility of difficulty with spontaneous ventilation under anesthesia necessitating airway intervention, PONV, and rare risks such as cardiac or respiratory or neurological events, and allergic reactions. Discussed the role of CRNA in patient's perioperative care. Patient understands.)       Anesthesia Quick Evaluation

## 2023-03-24 ENCOUNTER — Encounter: Payer: Self-pay | Admitting: Gastroenterology

## 2023-03-28 ENCOUNTER — Encounter: Payer: Self-pay | Admitting: Gastroenterology

## 2023-05-31 ENCOUNTER — Ambulatory Visit
Admission: RE | Admit: 2023-05-31 | Discharge: 2023-05-31 | Disposition: A | Discharge status: Home / Self Care | Payer: BC Managed Care – PPO | Source: Ambulatory Visit | Attending: Obstetrics and Gynecology | Admitting: Obstetrics and Gynecology

## 2023-05-31 DIAGNOSIS — Z1231 Encounter for screening mammogram for malignant neoplasm of breast: Secondary | ICD-10-CM | POA: Diagnosis present

## 2023-06-02 ENCOUNTER — Ambulatory Visit: Payer: BC Managed Care – PPO | Admitting: Dermatology

## 2023-06-02 DIAGNOSIS — D0362 Melanoma in situ of left upper limb, including shoulder: Secondary | ICD-10-CM | POA: Diagnosis not present

## 2023-06-02 DIAGNOSIS — D229 Melanocytic nevi, unspecified: Secondary | ICD-10-CM

## 2023-06-02 DIAGNOSIS — D492 Neoplasm of unspecified behavior of bone, soft tissue, and skin: Secondary | ICD-10-CM | POA: Diagnosis not present

## 2023-06-02 DIAGNOSIS — L814 Other melanin hyperpigmentation: Secondary | ICD-10-CM

## 2023-06-02 DIAGNOSIS — D039 Melanoma in situ, unspecified: Secondary | ICD-10-CM

## 2023-06-02 DIAGNOSIS — Z86018 Personal history of other benign neoplasm: Secondary | ICD-10-CM

## 2023-06-02 DIAGNOSIS — W908XXA Exposure to other nonionizing radiation, initial encounter: Secondary | ICD-10-CM | POA: Diagnosis not present

## 2023-06-02 DIAGNOSIS — L821 Other seborrheic keratosis: Secondary | ICD-10-CM

## 2023-06-02 DIAGNOSIS — L578 Other skin changes due to chronic exposure to nonionizing radiation: Secondary | ICD-10-CM

## 2023-06-02 DIAGNOSIS — Z1283 Encounter for screening for malignant neoplasm of skin: Secondary | ICD-10-CM

## 2023-06-02 DIAGNOSIS — D1801 Hemangioma of skin and subcutaneous tissue: Secondary | ICD-10-CM

## 2023-06-02 HISTORY — DX: Melanoma in situ, unspecified: D03.9

## 2023-06-02 NOTE — Patient Instructions (Addendum)

## 2023-06-02 NOTE — Progress Notes (Signed)
   Follow-Up Visit   Subjective  Teresa Berry is a 47 y.o. female who presents for the following: Skin Cancer Screening and Full Body Skin Exam, hx of Dysplastic nevus  The patient presents for Total-Body Skin Exam (TBSE) for skin cancer screening and mole check. The patient has spots, moles and lesions to be evaluated, some may be new or changing and the patient may have concern these could be cancer.    The following portions of the chart were reviewed this encounter and updated as appropriate: medications, allergies, medical history  Review of Systems:  No other skin or systemic complaints except as noted in HPI or Assessment and Plan.  Objective  Well appearing patient in no apparent distress; mood and affect are within normal limits.  A full examination was performed including scalp, head, eyes, ears, nose, lips, neck, chest, axillae, abdomen, back, buttocks, bilateral upper extremities, bilateral lower extremities, hands, feet, fingers, toes, fingernails, and toenails. All findings within normal limits unless otherwise noted below.   Relevant physical exam findings are noted in the Assessment and Plan.  left posterior shoulder 0.7 cm medium brown irregular macule          Assessment & Plan   SKIN CANCER SCREENING PERFORMED TODAY.  ACTINIC DAMAGE - Chronic condition, secondary to cumulative UV/sun exposure - diffuse scaly erythematous macules with underlying dyspigmentation - Recommend daily broad spectrum sunscreen SPF 30+ to sun-exposed areas, reapply every 2 hours as needed.  - Staying in the shade or wearing long sleeves, sun glasses (UVA+UVB protection) and wide brim hats (4-inch brim around the entire circumference of the hat) are also recommended for sun protection.  - Call for new or changing lesions.  LENTIGINES, SEBORRHEIC KERATOSES, HEMANGIOMAS - Benign normal skin lesions - Benign-appearing - Call for any changes  MELANOCYTIC NEVI - Tan-brown  and/or pink-flesh-colored symmetric macules and papules - Benign appearing on exam today - Observation - Call clinic for new or changing moles - Recommend daily use of broad spectrum spf 30+ sunscreen to sun-exposed areas.   HISTORY OF DYSPLASTIC NEVUS Upper mid back  No evidence of recurrence today Recommend regular full body skin exams Recommend daily broad spectrum sunscreen SPF 30+ to sun-exposed areas, reapply every 2 hours as needed.  Call if any new or changing lesions are noted between office visits   Neoplasm of skin left posterior shoulder  Epidermal / dermal shaving  Lesion diameter (cm):  0.7 Informed consent: discussed and consent obtained   Timeout: patient name, date of birth, surgical site, and procedure verified   Procedure prep:  Patient was prepped and draped in usual sterile fashion Prep type:  Isopropyl alcohol Anesthesia: the lesion was anesthetized in a standard fashion   Anesthetic:  1% lidocaine w/ epinephrine 1-100,000 buffered w/ 8.4% NaHCO3 Hemostasis achieved with: pressure, aluminum chloride and electrodesiccation   Outcome: patient tolerated procedure well   Post-procedure details: sterile dressing applied and wound care instructions given   Dressing type: bandage and petrolatum    Specimen 1 - Surgical pathology Differential Diagnosis: R/O Dysplastic nevus   Check Margins: No   Return in about 1 year (around 06/01/2024) for TBSE, hx of Dysplastic nevus .  IAngelique Holm, CMA, am acting as scribe for Armida Sans, MD .   Documentation: I have reviewed the above documentation for accuracy and completeness, and I agree with the above.  Armida Sans, MD

## 2023-06-04 ENCOUNTER — Encounter: Payer: Self-pay | Admitting: Dermatology

## 2023-06-08 ENCOUNTER — Telehealth: Payer: Self-pay

## 2023-06-08 LAB — SURGICAL PATHOLOGY

## 2023-06-08 NOTE — Telephone Encounter (Signed)
Patient advised of biopsy result per Dr. Gwen Pounds and scheduled surgery. aw

## 2023-06-08 NOTE — Telephone Encounter (Signed)
-----   Message from Armida Sans sent at 06/08/2023  5:33 PM EDT ----- FINAL DIAGNOSIS        1. 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 She is awaiting call to schedule surgery appt.

## 2023-06-28 ENCOUNTER — Ambulatory Visit: Payer: BC Managed Care – PPO | Admitting: Dermatology

## 2023-06-28 ENCOUNTER — Encounter: Payer: Self-pay | Admitting: Dermatology

## 2023-06-28 DIAGNOSIS — Z7189 Other specified counseling: Secondary | ICD-10-CM | POA: Diagnosis not present

## 2023-06-28 DIAGNOSIS — D0362 Melanoma in situ of left upper limb, including shoulder: Secondary | ICD-10-CM

## 2023-06-28 MED ORDER — DOXYCYCLINE MONOHYDRATE 100 MG PO CAPS: ORAL_CAPSULE | ORAL | 0 refills | Status: DC

## 2023-06-28 MED ORDER — MUPIROCIN 2 % EX OINT: TOPICAL_OINTMENT | CUTANEOUS | 0 refills | Status: DC

## 2023-06-28 NOTE — Patient Instructions (Signed)

## 2023-06-28 NOTE — Progress Notes (Signed)
Follow-Up Visit   Subjective  Teresa Berry is a 47 y.o. female who presents for the following: MMIS arising in a dysplastic nevus at the L post shoulder. Pt here today for excision and melanoma counseling and discussion of melanoma "matrix"   The following portions of the chart were reviewed this encounter and updated as appropriate: medications, allergies, medical history  Review of Systems:  No other skin or systemic complaints except as noted in HPI or Assessment and Plan.  Objective  Well appearing patient in no apparent distress; mood and affect are within normal limits.   A focused examination was performed of the following areas:   Relevant exam findings are noted in the Assessment and Plan.  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) L post shoulder  Skin excision  Lesion length (cm):  1.1 Lesion width (cm):  1.1 Total excision diameter (cm):  2.2 Informed consent: discussed and consent obtained   Timeout: patient name, date of birth, surgical site, and procedure verified   Procedure prep:  Patient was prepped and draped in usual sterile fashion Prep type:  Isopropyl alcohol and povidone-iodine Anesthesia: the lesion was anesthetized in a standard fashion   Anesthesia comment:  10 cc lidocaine with epi, bupivocaine 6 cc Anesthetic:  1% lidocaine w/ epinephrine 1-100,000 buffered w/ 8.4% NaHCO3 Instrument used: #15 blade   Hemostasis achieved with: pressure   Hemostasis achieved with comment:  Electrocautery Outcome: patient tolerated procedure well with no complications   Post-procedure details: sterile dressing applied and wound care instructions given   Dressing type: bandage and pressure dressing    Skin repair Complexity:  Complex Final length (cm):  6.5 Informed consent: discussed and consent obtained   Timeout: patient name, date of birth, surgical  site, and procedure verified   Procedure prep:  Patient was prepped and draped in usual sterile fashion Prep type:  Povidone-iodine Anesthesia: the lesion was anesthetized in a standard fashion   Reason for type of repair: reduce tension to allow closure, reduce the risk of dehiscence, infection, and necrosis, reduce subcutaneous dead space and avoid a hematoma, allow closure of the large defect, preserve normal anatomy, preserve normal anatomical and functional relationships and enhance both functionality and cosmetic results   Undermining comment:  Undermining defect 2.2 cm Subcutaneous layers (deep stitches):  Suture size:  2-0 Suture type: Vicryl (polyglactin 910)   Fine/surface layer approximation (top stitches):  Suture size:  2-0 Stitches: simple running   Suture removal (days):  7 Hemostasis achieved with: suture and pressure Outcome: patient tolerated procedure well with no complications   Post-procedure details: sterile dressing applied and wound care instructions given   Dressing type: bandage and pressure dressing (Mupirocin 2% ointment)    Specimen 1 - Surgical pathology Differential Diagnosis: D48.5 bx proven MMIS rising in dysplastic nevus [id number] Check Margins: Yes  COUNSELING: Melanoma Matrix Counseling and Coordination of Care  Discussed diagnosis in detail including significance of melanoma diagnosis which can be potentially lethal.  Discussed treatment recommendations in detail advising that treatment recommendations are based on longitudinal studies and retrospective studies and are nationwide protocols.  Advised there is always potential for melanoma recurrence even after definitive treatment.  After definitive treatment, we recommend Skin Cancer Screening Exams (with total-body skin exams) every 3 months for a year; then every 4 months for a year; then every 6 months for  3 years.  At 5 years post treatment, if all appears well,  we would recommend at least  yearly Skin Cancer Screenings (with total-body skin exams) for the rest of your life.  The patient was given time for questions and these were answered.  We recommend frequent self skin examinations; photoprotection with sunscreen, sun protective clothing, hats, sunglasses and sun avoidance.  If the patient notices any new or changing skin lesions the patient should return to the office immediately for evaluation.   Start Doxycycline 100 mg po BID x 5 days. Doxycycline should be taken with food to prevent nausea. Do not lay down for 30 minutes after taking. Be cautious with sun exposure and use good sun protection while on this medication. Pregnant women should not take this medication.   Start Mupirocin 2% ointment QD to wound.      Return in about 3 months (around 09/28/2023) for TBSE - hx MMIS, 7 days for suture removal.  I, Cari Caraway, CMA, am acting as scribe for Armida Sans, MD .   Documentation: I have reviewed the above documentation for accuracy and completeness, and I agree with the above.  Armida Sans, MD

## 2023-07-04 LAB — SURGICAL PATHOLOGY

## 2023-07-05 ENCOUNTER — Ambulatory Visit (INDEPENDENT_AMBULATORY_CARE_PROVIDER_SITE_OTHER): Payer: BC Managed Care – PPO | Admitting: Dermatology

## 2023-07-05 DIAGNOSIS — Z85820 Personal history of malignant melanoma of skin: Secondary | ICD-10-CM

## 2023-07-05 DIAGNOSIS — Z4889 Encounter for other specified surgical aftercare: Secondary | ICD-10-CM

## 2023-07-05 NOTE — Progress Notes (Signed)
   Follow-Up Visit   Subjective  Teresa Berry is a 47 y.o. female who presents for the following: Suture removal  Pathology showed a margins free MMIS of the L sup scapula.   The following portions of the chart were reviewed this encounter and updated as appropriate: medications, allergies, medical history  Review of Systems:  No other skin or systemic complaints except as noted in HPI or Assessment and Plan.  Objective  Well appearing patient in no apparent distress; mood and affect are within normal limits.  Areas Examined: The trunk  Relevant physical exam findings are noted in the Assessment and Plan.    Assessment & Plan    Encounter for Removal of Sutures - Incision site is clean, dry and intact. - Wound cleansed, sutures removed, wound cleansed and steri strips applied.  - Discussed pathology results showing a margins free melanoma in situ - Patient advised to keep steri-strips dry until they fall off. - Scars remodel for a full year. - Once steri-strips fall off, patient can apply over-the-counter silicone scar cream once to twice a day to help with scar remodeling if desired. - Patient advised to call with any concerns or if they notice any new or changing lesions.  Return for appointment as scheduled.  Maylene Roes, CMA, am acting as scribe for Armida Sans, MD .   Documentation: I have reviewed the above documentation for accuracy and completeness, and I agree with the above.  Armida Sans, MD

## 2023-07-05 NOTE — Patient Instructions (Signed)

## 2023-07-09 ENCOUNTER — Encounter: Payer: Self-pay | Admitting: Dermatology

## 2023-07-12 ENCOUNTER — Encounter: Payer: Self-pay | Admitting: Dermatology

## 2023-10-12 ENCOUNTER — Ambulatory Visit: Payer: 59 | Admitting: Dermatology

## 2023-10-12 ENCOUNTER — Encounter: Payer: Self-pay | Admitting: Dermatology

## 2023-10-12 DIAGNOSIS — Z1283 Encounter for screening for malignant neoplasm of skin: Secondary | ICD-10-CM

## 2023-10-12 DIAGNOSIS — L578 Other skin changes due to chronic exposure to nonionizing radiation: Secondary | ICD-10-CM | POA: Diagnosis not present

## 2023-10-12 DIAGNOSIS — W908XXA Exposure to other nonionizing radiation, initial encounter: Secondary | ICD-10-CM | POA: Diagnosis not present

## 2023-10-12 DIAGNOSIS — L814 Other melanin hyperpigmentation: Secondary | ICD-10-CM

## 2023-10-12 DIAGNOSIS — D2372 Other benign neoplasm of skin of left lower limb, including hip: Secondary | ICD-10-CM

## 2023-10-12 DIAGNOSIS — Z86018 Personal history of other benign neoplasm: Secondary | ICD-10-CM

## 2023-10-12 DIAGNOSIS — Z86006 Personal history of melanoma in-situ: Secondary | ICD-10-CM

## 2023-10-12 DIAGNOSIS — L821 Other seborrheic keratosis: Secondary | ICD-10-CM

## 2023-10-12 DIAGNOSIS — D1801 Hemangioma of skin and subcutaneous tissue: Secondary | ICD-10-CM

## 2023-10-12 DIAGNOSIS — D229 Melanocytic nevi, unspecified: Secondary | ICD-10-CM

## 2023-10-12 DIAGNOSIS — Z85820 Personal history of malignant melanoma of skin: Secondary | ICD-10-CM

## 2023-10-12 DIAGNOSIS — L719 Rosacea, unspecified: Secondary | ICD-10-CM

## 2023-10-12 NOTE — Patient Instructions (Signed)

## 2023-10-12 NOTE — Progress Notes (Signed)
Follow-Up Visit   Subjective  Teresa Berry is a 48 y.o. female who presents for the following: 3 months Skin Cancer Screening and Full Body Skin Exam, hx of Melanoma in situ on the left posterior shoulder excised 06/28/23 Hx of Dysplastic nevus, patient concerned about growth on her frontal scalp hair line.   The patient presents for Total-Body Skin Exam (TBSE) for skin cancer screening and mole check. The patient has spots, moles and lesions to be evaluated, some may be new or changing and the patient may have concern these could be cancer.  The following portions of the chart were reviewed this encounter and updated as appropriate: medications, allergies, medical history  Review of Systems:  No other skin or systemic complaints except as noted in HPI or Assessment and Plan.  Objective  Well appearing patient in no apparent distress; mood and affect are within normal limits.  A full examination was performed including scalp, head, eyes, ears, nose, lips, neck, chest, axillae, abdomen, back, buttocks, bilateral upper extremities, bilateral lower extremities, hands, feet, fingers, toes, fingernails, and toenails. All findings within normal limits unless otherwise noted below.   Relevant physical exam findings are noted in the Assessment and Plan.    Assessment & Plan   SKIN CANCER SCREENING PERFORMED TODAY.  ACTINIC DAMAGE - Chronic condition, secondary to cumulative UV/sun exposure - diffuse scaly erythematous macules with underlying dyspigmentation - Recommend daily broad spectrum sunscreen SPF 30+ to sun-exposed areas, reapply every 2 hours as needed.  - Staying in the shade or wearing long sleeves, sun glasses (UVA+UVB protection) and wide brim hats (4-inch brim around the entire circumference of the hat) are also recommended for sun protection.  - Call for new or changing lesions.  LENTIGINES, HEMANGIOMAS - Benign normal skin lesions - Benign-appearing - Call for any  changes  SEBORRHEIC KERATOSIS Frontal scalp hairline  - Stuck-on, waxy, tan-brown papules and/or plaques  - Benign-appearing - Discussed benign etiology and prognosis. - Observe - Call for any changes  MELANOCYTIC NEVI - Tan-brown and/or pink-flesh-colored symmetric macules and papules - Benign appearing on exam today - Observation - Call clinic for new or changing moles - Recommend daily use of broad spectrum spf 30+ sunscreen to sun-exposed areas.   DERMATOFIBROMA Exam: Firm pink/brown papulenodule with dimple sign on the left lateral thigh   A dermatofibroma is a benign growth possibly related to trauma, such as an insect bite, cut from shaving, or inflamed acne-type bump.  Treatment options to remove include shave or excision with resulting scar and risk of recurrence.  Since benign-appearing and not bothersome, will observe for now.    ROSACEA Exam Mid face erythema with telangiectasias  Chronic condition with duration or expected duration over one year. Currently well-controlled.  Rosacea is a chronic progressive skin condition usually affecting the face of adults, causing redness and/or acne bumps. It is treatable but not curable. It sometimes affects the eyes (ocular rosacea) as well. It may respond to topical and/or systemic medication and can flare with stress, sun exposure, alcohol, exercise, topical steroids (including hydrocortisone/cortisone 10) and some foods.  Daily application of broad spectrum spf 30+ sunscreen to face is recommended to reduce flares. Treatment Plan No treatment needed    HISTORY OF DYSPLASTIC NEVUS Upper mid back-mod atypia 05/20/21 No evidence of recurrence today Recommend regular full body skin exams Recommend daily broad spectrum sunscreen SPF 30+ to sun-exposed areas, reapply every 2 hours as needed.  Call if any new or changing lesions  are noted between office visits   HISTORY OF MELANOMA IN SITU Left posterior shoulder -WLE 06/28/23 - No  evidence of recurrence today - no lymphadenopathy - Recommend regular full body skin exams - Recommend daily broad spectrum sunscreen SPF 30+ to sun-exposed areas, reapply every 2 hours as needed.  - Call if any new or changing lesions are noted between office visits    Return in about 3 months (around 01/09/2024) for TBSE, hx of Melanoma in situ, hx of Dysplastic nevus .  IAngelique Holm, CMA, am acting as scribe for Armida Sans, MD .   Documentation: I have reviewed the above documentation for accuracy and completeness, and I agree with the above.  Armida Sans, MD

## 2023-10-20 ENCOUNTER — Ambulatory Visit: Payer: BC Managed Care – PPO | Admitting: Dermatology

## 2024-02-16 ENCOUNTER — Other Ambulatory Visit: Payer: Self-pay | Admitting: Obstetrics and Gynecology

## 2024-02-16 DIAGNOSIS — F320 Major depressive disorder, single episode, mild: Secondary | ICD-10-CM

## 2024-02-22 ENCOUNTER — Ambulatory Visit: Payer: 59 | Admitting: Dermatology

## 2024-02-22 ENCOUNTER — Encounter: Payer: Self-pay | Admitting: Dermatology

## 2024-02-22 DIAGNOSIS — D229 Melanocytic nevi, unspecified: Secondary | ICD-10-CM

## 2024-02-22 DIAGNOSIS — Z85820 Personal history of malignant melanoma of skin: Secondary | ICD-10-CM

## 2024-02-22 DIAGNOSIS — L578 Other skin changes due to chronic exposure to nonionizing radiation: Secondary | ICD-10-CM

## 2024-02-22 DIAGNOSIS — Z1283 Encounter for screening for malignant neoplasm of skin: Secondary | ICD-10-CM | POA: Diagnosis not present

## 2024-02-22 DIAGNOSIS — L814 Other melanin hyperpigmentation: Secondary | ICD-10-CM

## 2024-02-22 DIAGNOSIS — Z86018 Personal history of other benign neoplasm: Secondary | ICD-10-CM

## 2024-02-22 NOTE — Progress Notes (Signed)
   Follow-Up Visit   Subjective  Teresa Berry is a 48 y.o. female who presents for the following: Skin Cancer Screening and Full Body Skin Exam  The patient presents for Total-Body Skin Exam (TBSE) for skin cancer screening and mole check. The patient has spots, moles and lesions to be evaluated, some may be new or changing and the patient may have concern these could be cancer.  The following portions of the chart were reviewed this encounter and updated as appropriate: medications, allergies, medical history  Review of Systems:  No other skin or systemic complaints except as noted in HPI or Assessment and Plan.  Objective  Well appearing patient in no apparent distress; mood and affect are within normal limits.  A full examination was performed including scalp, head, eyes, ears, nose, lips, neck, chest, axillae, abdomen, back, buttocks, bilateral upper extremities, bilateral lower extremities, hands, feet, fingers, toes, fingernails, and toenails. All findings within normal limits unless otherwise noted below.   Relevant physical exam findings are noted in the Assessment and Plan.    Assessment & Plan   SKIN CANCER SCREENING PERFORMED TODAY.  ACTINIC DAMAGE - Chronic condition, secondary to cumulative UV/sun exposure - diffuse scaly erythematous macules with underlying dyspigmentation - Recommend daily broad spectrum sunscreen SPF 30+ to sun-exposed areas, reapply every 2 hours as needed.  - Staying in the shade or wearing long sleeves, sun glasses (UVA+UVB protection) and wide brim hats (4-inch brim around the entire circumference of the hat) are also recommended for sun protection.  - Call for new or changing lesions.  LENTIGINES, SEBORRHEIC KERATOSES, HEMANGIOMAS - Benign normal skin lesions - Benign-appearing - Call for any changes  MELANOCYTIC NEVI - Tan-brown and/or pink-flesh-colored symmetric macules and papules - Benign appearing on exam today - Observation -  Call clinic for new or changing moles - Recommend daily use of broad spectrum spf 30+ sunscreen to sun-exposed areas.   HISTORY OF MELANOMA IN SITU - L post shoulder, WLE 06/28/23 - No evidence of recurrence today - Recommend regular full body skin exams - Recommend daily broad spectrum sunscreen SPF 30+ to sun-exposed areas, reapply every 2 hours as needed.  - Call if any new or changing lesions are noted between office visits  HISTORY OF DYSPLASTIC NEVI No evidence of recurrence today Recommend regular full body skin exams Recommend daily broad spectrum sunscreen SPF 30+ to sun-exposed areas, reapply every 2 hours as needed.  Call if any new or changing lesions are noted between office visits  Return in about 3 months (around 05/24/2024) for TBSE - Hx MM, dysplastic nevi.  LILLETTE Rosina Mayans, CMA, am acting as scribe for Alm Rhyme, MD .  Documentation: I have reviewed the above documentation for accuracy and completeness, and I agree with the above.  Alm Rhyme, MD

## 2024-02-22 NOTE — Patient Instructions (Signed)

## 2024-02-22 NOTE — Progress Notes (Unsigned)
 PCP:  Patient, [name]   No chief complaint on file.    HPI:      Teresa Berry is a 48 y.o. [id number] whose LMP was No LMP recorded., presents today for her annual examination.  Her menses are regular every 28-30 days, lasting 2 days.  Dysmenorrhea mild, no BTB. Starting to get vasomotor sx.   Sex activity: single partner, contraception - vasectomy. No pain/dryness. Last Pap: 02/22/23 Results were: no abnormalities /neg HPV DNA   Last mammogram: 05/31/23 Results were: normal, repeat screening in 12 months; hx of breast calcifications There is no FH of breast cancer. There is no FH of ovarian cancer. The patient does not do self-breast exams.  Tobacco use: The patient denies current or previous tobacco use. Alcohol use: wkly No drug use.  Exercise: min active  Colonoscopy: 7/24 with Dr. Therisa with polyp, repeat after 7 yrs.   She does get adequate calcium and Vitamin D in her diet. Hx of Vit D deficiency in past.   On lexapro  20 mg for depression with sx control. Wants to cont. No side effects.  Labs with PCP.  Having issues with SUI and leakage without sensation to void. PCP started her on gemtesa with sx improvement. Aware to start kegels.    Patient Active Problem List   Diagnosis Date Noted   Encounter for screening colonoscopy 03/23/2023   Adenomatous polyp of colon 03/23/2023   Current mild episode of major depressive disorder without prior episode (HCC) 01/18/2022   Vitamin D deficiency 02/13/2018   Depression, major, in remission (HCC) 10/03/2016   Obesity 10/02/2013    Past Surgical History:  Procedure Laterality Date   CHOLECYSTECTOMY     COLONOSCOPY WITH PROPOFOL  N/A 03/23/2023   Procedure: COLONOSCOPY WITH PROPOFOL ;  Surgeon: Therisa Bi, MD;  Location: [facility] ENDOSCOPY;  Service: Gastroenterology;  Laterality: N/A;   POLYPECTOMY  03/23/2023   Procedure: POLYPECTOMY INTESTINAL;  Surgeon: Therisa Bi, MD;  Location: [facility] ENDOSCOPY;  Service:  Gastroenterology;;   SHELLEE TOOTH EXTRACTION     X4 extracted     Family History  Problem Relation Age of Onset   Diabetes Father    Diabetes Maternal Grandmother    Breast cancer Neg Hx     Social History   Socioeconomic History   Marital status: Married    Spouse name: Not on file   Number of children: Not on file   Years of education: Not on file   Highest education level: Not on file  Occupational History   Not on file  Tobacco Use   Smoking status: Never   Smokeless tobacco: Never  Vaping Use   Vaping status: Never Used  Substance and Sexual Activity   Alcohol use: Yes    Comment: occasional    Drug use: Never   Sexual activity: Yes    Birth control/protection: Surgical    Comment: Vasectomy   Other Topics Concern   Not on file  Social History Narrative   Not on file   Social Drivers of Health   Financial Resource Strain: Not on file  Food Insecurity: Not on file  Transportation Needs: Not on file  Physical Activity: Not on file  Stress: Not on file  Social Connections: Not on file  Intimate Partner Violence: Not on file     Current Outpatient Medications:    cetirizine (ZYRTEC) 10 MG chewable tablet, Chew 10 mg by mouth daily., Disp: , Rfl:    clobetasol  ointment (TEMOVATE ) 0.05 %,  Apply 1 application topically 2 (two) times daily. PRN poison ivy (Patient not taking: Reported on 02/22/2024), Disp: 60 g, Rfl: 1   doxycycline  (MONODOX ) 100 MG capsule, Take one cap po BID with food and plenty of drink. (Patient not taking: Reported on 02/22/2024), Disp: 10 capsule, Rfl: 0   escitalopram  (LEXAPRO ) 20 MG tablet, TAKE 1 TABLET BY MOUTH EVERY DAY, Disp: 30 tablet, Rfl: 0   GEMTESA 75 MG TABS, Take 1 tablet by mouth daily., Disp: , Rfl:    Multiple Vitamin (MULTIVITAMIN) tablet, Take 1 tablet by mouth daily., Disp: , Rfl:    mupirocin  ointment (BACTROBAN ) 2 %, Apply to excision site QD. (Patient not taking: Reported on 02/22/2024), Disp: 22 g, Rfl: 0    triamcinolone  cream (KENALOG ) 0.1 %, Apply to affected areas twice daily for up to 2-3 weeks prn. Avoid applying to face, groin, and axilla. Use as directed. Long-term use can cause thinning of the skin. (Patient not taking: Reported on 02/22/2024), Disp: 80 g, Rfl: 1     ROS:  Review of Systems  Constitutional:  Negative for fatigue, fever and unexpected weight change.  Respiratory:  Negative for cough, shortness of breath and wheezing.   Cardiovascular:  Negative for chest pain, palpitations and leg swelling.  Gastrointestinal:  Negative for blood in stool, constipation, diarrhea, nausea and vomiting.  Endocrine: Negative for cold intolerance, heat intolerance and polyuria.  Genitourinary:  Negative for dyspareunia, dysuria, flank pain, frequency, genital sores, hematuria, menstrual problem, pelvic pain, urgency, vaginal bleeding, vaginal discharge and vaginal pain.  Musculoskeletal:  Negative for back pain, joint swelling and myalgias.  Skin:  Negative for rash.  Neurological:  Negative for dizziness, syncope, light-headedness, numbness and headaches.  Hematological:  Negative for adenopathy.  Psychiatric/Behavioral:  Negative for agitation, confusion, sleep disturbance and suicidal ideas. The patient is not nervous/anxious.    BREAST: No symptoms   Objective: There were no vitals taken for this visit.   Physical Exam Constitutional:      Appearance: She is well-developed.  Genitourinary:     Vulva normal.     Right Labia: No rash, tenderness or lesions.    Left Labia: No tenderness, lesions or rash.    No vaginal discharge, erythema or tenderness.      Right Adnexa: not tender and no mass present.    Left Adnexa: not tender and no mass present.    No cervical friability or polyp.     Uterus is not enlarged or tender.  Breasts:    Right: No mass, nipple discharge, skin change or tenderness.     Left: No mass, nipple discharge, skin change or tenderness.  Neck:      Thyroid: No thyromegaly.   Cardiovascular:     Rate and Rhythm: Normal rate and regular rhythm.     Heart sounds: Normal heart sounds. No murmur heard. Pulmonary:     Effort: Pulmonary effort is normal.     Breath sounds: Normal breath sounds.  Abdominal:     Palpations: Abdomen is soft.     Tenderness: There is no abdominal tenderness. There is no guarding or rebound.   Musculoskeletal:        General: Normal range of motion.     Cervical back: Normal range of motion.  Lymphadenopathy:     Cervical: No cervical adenopathy.   Neurological:     General: No focal deficit present.     Mental Status: She is alert and oriented to person, place, and time.  Cranial Nerves: No cranial nerve deficit.   Skin:    General: Skin is warm and dry.   Psychiatric:        Mood and Affect: Mood normal.        Behavior: Behavior normal.        Thought Content: Thought content normal.        Judgment: Judgment normal.  Vitals reviewed.     Assessment/Plan: Encounter for annual routine gynecological examination  Cervical cancer screening - Plan: Cytology - PAP  Screening for HPV (human papillomavirus) - Plan: Cytology - PAP  Encounter for screening mammogram for malignant neoplasm of breast - Plan: MM 3D SCREENING MAMMOGRAM BILATERAL BREAST; pt to schedule mammo  Current mild episode of major depressive disorder without prior episode (HCC) - Plan: escitalopram  (LEXAPRO ) 20 MG tablet; Rx RF. Doing well  Screening for colon cancer - Plan: Ambulatory referral to Gastroenterology; colonoscopy/cologuard discussed. Pt elects colonoscopy.    No orders of the defined types were placed in this encounter.            GYN counsel breast self exam, mammography screening, adequate intake of calcium and vitamin D, diet and exercise     F/U  No follow-ups on file.  Kimani Hovis B. Dashana Guizar, PA-C 02/22/2024 4:15 PM

## 2024-02-23 ENCOUNTER — Ambulatory Visit (INDEPENDENT_AMBULATORY_CARE_PROVIDER_SITE_OTHER): Admitting: Obstetrics and Gynecology

## 2024-02-23 ENCOUNTER — Encounter: Payer: Self-pay | Admitting: Obstetrics and Gynecology

## 2024-02-23 VITALS — BP 112/74 | HR 75 | Ht 69.0 in | Wt 175.0 lb

## 2024-02-23 DIAGNOSIS — F320 Major depressive disorder, single episode, mild: Secondary | ICD-10-CM | POA: Diagnosis not present

## 2024-02-23 DIAGNOSIS — K5904 Chronic idiopathic constipation: Secondary | ICD-10-CM | POA: Diagnosis not present

## 2024-02-23 DIAGNOSIS — N951 Menopausal and female climacteric states: Secondary | ICD-10-CM | POA: Diagnosis not present

## 2024-02-23 DIAGNOSIS — Z1231 Encounter for screening mammogram for malignant neoplasm of breast: Secondary | ICD-10-CM

## 2024-02-23 DIAGNOSIS — Z01419 Encounter for gynecological examination (general) (routine) without abnormal findings: Secondary | ICD-10-CM

## 2024-02-23 DIAGNOSIS — Z1211 Encounter for screening for malignant neoplasm of colon: Secondary | ICD-10-CM

## 2024-02-23 MED ORDER — ESCITALOPRAM OXALATE 20 MG PO TABS
20.0000 mg | ORAL_TABLET | Freq: Every day | ORAL | 3 refills | Status: AC
Start: 2024-02-23 — End: ?

## 2024-02-23 NOTE — Patient Instructions (Signed)
 I value your feedback and you entrusting [place] with your care. If you get a [facility] patient survey, I would appreciate you taking the time to let us know about your experience today. Thank you!  [facility] ([place])--[phone]

## 2024-05-29 ENCOUNTER — Ambulatory Visit: Admitting: Dermatology

## 2024-05-29 DIAGNOSIS — L814 Other melanin hyperpigmentation: Secondary | ICD-10-CM | POA: Diagnosis not present

## 2024-05-29 DIAGNOSIS — D225 Melanocytic nevi of trunk: Secondary | ICD-10-CM | POA: Diagnosis not present

## 2024-05-29 DIAGNOSIS — Z85820 Personal history of malignant melanoma of skin: Secondary | ICD-10-CM

## 2024-05-29 DIAGNOSIS — D2271 Melanocytic nevi of right lower limb, including hip: Secondary | ICD-10-CM

## 2024-05-29 DIAGNOSIS — Z86018 Personal history of other benign neoplasm: Secondary | ICD-10-CM

## 2024-05-29 DIAGNOSIS — W908XXA Exposure to other nonionizing radiation, initial encounter: Secondary | ICD-10-CM

## 2024-05-29 DIAGNOSIS — L821 Other seborrheic keratosis: Secondary | ICD-10-CM

## 2024-05-29 DIAGNOSIS — L820 Inflamed seborrheic keratosis: Secondary | ICD-10-CM

## 2024-05-29 DIAGNOSIS — D229 Melanocytic nevi, unspecified: Secondary | ICD-10-CM

## 2024-05-29 DIAGNOSIS — Z1283 Encounter for screening for malignant neoplasm of skin: Secondary | ICD-10-CM | POA: Diagnosis not present

## 2024-05-29 DIAGNOSIS — Z86006 Personal history of melanoma in-situ: Secondary | ICD-10-CM

## 2024-05-29 DIAGNOSIS — L578 Other skin changes due to chronic exposure to nonionizing radiation: Secondary | ICD-10-CM | POA: Diagnosis not present

## 2024-05-29 DIAGNOSIS — D489 Neoplasm of uncertain behavior, unspecified: Secondary | ICD-10-CM | POA: Diagnosis not present

## 2024-05-29 NOTE — Progress Notes (Unsigned)
 Follow-Up Visit   Subjective  Teresa Berry is a 48 y.o. female who presents for the following: Skin Cancer Screening and Full Body Skin Exam  Hx of melanoma, hx of dysplastic nevus   Mole at right cheek that she reports is a lot more raised.  The patient presents for Total-Body Skin Exam (TBSE) for skin cancer screening and mole check. The patient has spots, moles and lesions to be evaluated, some may be new or changing and the patient may have concern these could be cancer.  The following portions of the chart were reviewed this encounter and updated as appropriate: medications, allergies, medical history  Review of Systems:  No other skin or systemic complaints except as noted in HPI or Assessment and Plan.  Objective  Well appearing patient in no apparent distress; mood and affect are within normal limits.  A full examination was performed including scalp, head, eyes, ears, nose, lips, neck, chest, axillae, abdomen, back, buttocks, bilateral upper extremities, bilateral lower extremities, hands, feet, fingers, toes, fingernails, and toenails. All findings within normal limits unless otherwise noted below.   Relevant physical exam findings are noted in the Assessment and Plan.  right forehead x 1, right cheek x 1, right temple x 1 (3) Erythematous stuck-on, waxy papule or plaque Right medial aspect of 5th Distal Dorsal Toe 0.6 cm brown macule   right anterior shoulder lateral chest 0.4 cm dark brown macule    Assessment & Plan   HISTORY OF MELANOMA IN SITU - L post shoulder, WLE 06/28/23 - No evidence of recurrence today - Recommend regular full body skin exams - Recommend daily broad spectrum sunscreen SPF 30+ to sun-exposed areas, reapply every 2 hours as needed.  - Call if any new or changing lesions are noted between office visits   HISTORY OF DYSPLASTIC NEVI 05/20/2021 mid upper back - moderate atypia - recurrent dysplastic nevus margins free No evidence of  recurrence today Recommend regular full body skin exams Recommend daily broad spectrum sunscreen SPF 30+ to sun-exposed areas, reapply every 2 hours as needed.  Call if any new or changing lesions are noted between office visits Return in about 3 months (around 05/24/2024) for TBSE - Hx MM, dysplastic nevi.   SKIN CANCER SCREENING PERFORMED TODAY.  ACTINIC DAMAGE - Chronic condition, secondary to cumulative UV/sun exposure - diffuse scaly erythematous macules with underlying dyspigmentation - Recommend daily broad spectrum sunscreen SPF 30+ to sun-exposed areas, reapply every 2 hours as needed.  - Staying in the shade or wearing long sleeves, sun glasses (UVA+UVB protection) and wide brim hats (4-inch brim around the entire circumference of the hat) are also recommended for sun protection.  - Call for new or changing lesions.  LENTIGINES, SEBORRHEIC KERATOSES, HEMANGIOMAS - Benign normal skin lesions - Benign-appearing - Call for any changes  MELANOCYTIC NEVI - Tan-brown and/or pink-flesh-colored symmetric macules and papules - Benign appearing on exam today - Observation - Call clinic for new or changing moles - Recommend daily use of broad spectrum spf 30+ sunscreen to sun-exposed areas.   INFLAMED SEBORRHEIC KERATOSIS (3) right forehead x 1, right cheek x 1, right temple x 1 (3) Symptomatic, irritating, patient would like treated.  Discussed may need additional treatment  Destruction of lesion - right forehead x 1, right cheek x 1, right temple x 1 (3) Complexity: simple   Destruction method: cryotherapy   Informed consent: discussed and consent obtained   Timeout:  patient name, date of birth, surgical site, and  procedure verified Lesion destroyed using liquid nitrogen: Yes   Region frozen until ice ball extended beyond lesion: Yes   Outcome: patient tolerated procedure well with no complications   Post-procedure details: wound care instructions given    NEOPLASM OF  UNCERTAIN BEHAVIOR (2) Right medial aspect of 5th Distal Dorsal Toe Epidermal / dermal shaving  Lesion diameter (cm):  0.6 Informed consent: discussed and consent obtained   Timeout: patient name, date of birth, surgical site, and procedure verified   Procedure prep:  Patient was prepped and draped in usual sterile fashion Prep type:  Isopropyl alcohol Anesthesia: the lesion was anesthetized in a standard fashion   Anesthetic:  1% lidocaine  w/ epinephrine 1-100,000 buffered w/ 8.4% NaHCO3 Instrument used: flexible razor blade   Hemostasis achieved with: pressure, aluminum chloride and electrodesiccation   Outcome: patient tolerated procedure well   Post-procedure details: sterile dressing applied and wound care instructions given   Dressing type: bandage and petrolatum    Specimen 1 - Surgical pathology Differential Diagnosis: nevus r/o dysplasia   Check Margins: yes right anterior shoulder lateral chest Epidermal / dermal shaving  Lesion diameter (cm):  0.4 Informed consent: discussed and consent obtained   Timeout: patient name, date of birth, surgical site, and procedure verified   Procedure prep:  Patient was prepped and draped in usual sterile fashion Prep type:  Isopropyl alcohol Anesthesia: the lesion was anesthetized in a standard fashion   Anesthetic:  1% lidocaine  w/ epinephrine 1-100,000 buffered w/ 8.4% NaHCO3 Instrument used: flexible razor blade   Hemostasis achieved with: pressure, aluminum chloride and electrodesiccation   Outcome: patient tolerated procedure well   Post-procedure details: sterile dressing applied and wound care instructions given   Dressing type: bandage and petrolatum    Specimen 2 - Surgical pathology Differential Diagnosis: nevus r/o dysplasia   Check Margins:yes Nevus r/o dysplasia  Return in about 4 months (around 09/28/2024) for TBSE.  IEleanor Blush, CMA, am acting as scribe for Alm Rhyme, MD.   Documentation: I have  reviewed the above documentation for accuracy and completeness, and I agree with the above.  Alm Rhyme, MD

## 2024-05-29 NOTE — Patient Instructions (Addendum)
 Biopsy Wound Care Instructions  Leave the original bandage on for 24 hours if possible.  If the bandage becomes soaked or soiled before that time, it is OK to remove it and examine the wound.  A small amount of post-operative bleeding is normal.  If excessive bleeding occurs, remove the bandage, place gauze over the site and apply continuous pressure (no peeking) over the area for 30 minutes. If this does not work, please call our clinic as soon as possible or page your doctor if it is after hours.   Once a day, cleanse the wound with soap and water. It is fine to shower. If a thick crust develops you may use a Q-tip dipped into dilute hydrogen peroxide (mix 1:1 with water) to dissolve it.  Hydrogen peroxide can slow the healing process, so use it only as needed.    After washing, apply petroleum jelly (Vaseline) or an antibiotic ointment if your doctor prescribed one for you, followed by a bandage.    For best healing, the wound should be covered with a layer of ointment at all times. If you are not able to keep the area covered with a bandage to hold the ointment in place, this may mean re-applying the ointment several times a day.  Continue this wound care until the wound has healed and is no longer open.   Itching and mild discomfort is normal during the healing process. However, if you develop pain or severe itching, please call our office.   If you have any discomfort, you can take Tylenol  (acetaminophen ) or ibuprofen as directed on the bottle. (Please do not take these if you have an allergy to them or cannot take them for another reason).  Some redness, tenderness and white or yellow material in the wound is normal healing.  If the area becomes very sore and red, or develops a thick yellow-green material (pus), it may be infected; please notify us .    If you have stitches, return to clinic as directed to have the stitches removed. You will continue wound care for 2-3 days after the stitches  are removed.   Wound healing continues for up to one year following surgery. It is not unusual to experience pain in the scar from time to time during the interval.  If the pain becomes severe or the scar thickens, you should notify the office.    A slight amount of redness in a scar is expected for the first six months.  After six months, the redness will fade and the scar will soften and fade.  The color difference becomes less noticeable with time.  If there are any problems, return for a post-op surgery check at your earliest convenience.  To improve the appearance of the scar, you can use silicone scar gel, cream, or sheets (such as Mederma or Serica) every night for up to one year. These are available over the counter (without a prescription).  Please call our office at [phone] for any questions or concerns.    Cryotherapy Aftercare  Wash gently with soap and water everyday.   Apply Vaseline and Band-Aid daily until healed.   Seborrheic Keratosis  What causes seborrheic keratoses? Seborrheic keratoses are harmless, common skin growths that first appear during adult life.  As time goes by, more growths appear.  Some people may develop a large number of them.  Seborrheic keratoses appear on both covered and uncovered body parts.  They are not caused by sunlight.  The tendency to develop  seborrheic keratoses can be inherited.  They vary in color from skin-colored to gray, brown, or even black.  They can be either smooth or have a rough, warty surface.   Seborrheic keratoses are superficial and look as if they were stuck on the skin.  Under the microscope this type of keratosis looks like layers upon layers of skin.  That is why at times the top layer may seem to fall off, but the rest of the growth remains and re-grows.    Treatment Seborrheic keratoses do not need to be treated, but can easily be removed in the office.  Seborrheic keratoses often cause symptoms when they rub on  clothing or jewelry.  Lesions can be in the way of shaving.  If they become inflamed, they can cause itching, soreness, or burning.  Removal of a seborrheic keratosis can be accomplished by freezing, burning, or surgery. If any spot bleeds, scabs, or grows rapidly, please return to have it checked, as these can be an indication of a skin cancer.    Melanoma ABCDEs  Melanoma is the most dangerous type of skin cancer, and is the leading cause of death from skin disease.  You are more likely to develop melanoma if you: Have light-colored skin, light-colored eyes, or red or blond hair Spend a lot of time in the sun Tan regularly, either outdoors or in a tanning bed Have had blistering sunburns, especially during childhood Have a close family member who has had a melanoma Have atypical moles or large birthmarks  Early detection of melanoma is key since treatment is typically straightforward and cure rates are extremely high if we catch it early.   The first sign of melanoma is often a change in a mole or a new dark spot.  The ABCDE system is a way of remembering the signs of melanoma.  A for asymmetry:  The two halves do not match. B for border:  The edges of the growth are irregular. C for color:  A mixture of colors are present instead of an even brown color. D for diameter:  Melanomas are usually (but not always) greater than 6mm - the size of a pencil eraser. E for evolution:  The spot keeps changing in size, shape, and color.  Please check your skin once per month between visits. You can use a small mirror in front and a large mirror behind you to keep an eye on the back side or your body.   If you see any new or changing lesions before your next follow-up, please call to schedule a visit.  Please continue daily skin protection including broad spectrum sunscreen SPF 30+ to sun-exposed areas, reapplying every 2 hours as needed when you're outdoors.   Staying in the shade or wearing long  sleeves, sun glasses (UVA+UVB protection) and wide brim hats (4-inch brim around the entire circumference of the hat) are also recommended for sun protection.    Due to recent changes in healthcare laws, you may see results of your pathology and/or laboratory studies on MyChart before the doctors have had a chance to review them. We understand that in some cases there may be results that are confusing or concerning to you. Please understand that not all results are received at the same time and often the doctors may need to interpret multiple results in order to provide you with the best plan of care or course of treatment. Therefore, we ask that you please give us  2 business days to thoroughly  review all your results before contacting the office for clarification. Should we see a critical lab result, you will be contacted sooner.   If You Need Anything After Your Visit  If you have any questions or concerns for your doctor, please call our main line at [phone] and press option 4 to reach your doctor's medical assistant. If no one answers, please leave a voicemail as directed and we will return your call as soon as possible. Messages left after 4 pm will be answered the following business day.   You may also send us  a message via MyChart. We typically respond to MyChart messages within 1-2 business days.  For prescription refills, please ask your pharmacy to contact our office. Our fax number is [phone].  If you have an urgent issue when the clinic is closed that cannot wait until the next business day, you can page your doctor at the number below.    Please note that while we do our best to be available for urgent issues outside of office hours, we are not available 24/7.   If you have an urgent issue and are unable to reach us , you may choose to seek medical care at your doctor's office, retail clinic, urgent care center, or emergency room.  If you have a medical emergency, please  immediately call 911 or go to the emergency department.  Pager Numbers  - Dr. Hester: [phone]  - Dr. Jackquline: [phone]  - Dr. Claudene: [phone]   - Dr. Raymund: [phone]  In the event of inclement weather, please call our main line at [phone] for an update on the status of any delays or closures.  Dermatology Medication Tips: Please keep the boxes that topical medications come in in order to help keep track of the instructions about where and how to use these. Pharmacies typically print the medication instructions only on the boxes and not directly on the medication tubes.   If your medication is too expensive, please contact our office at [phone] option 4 or send us  a message through MyChart.   We are unable to tell what your co-pay for medications will be in advance as this is different depending on your insurance coverage. However, we may be able to find a substitute medication at lower cost or fill out paperwork to get insurance to cover a needed medication.   If a prior authorization is required to get your medication covered by your insurance company, please allow us  1-2 business days to complete this process.  Drug prices often vary depending on where the prescription is filled and some pharmacies may offer cheaper prices.  The website www.goodrx.com contains coupons for medications through different pharmacies. The prices here do not account for what the cost may be with help from insurance (it may be cheaper with your insurance), but the website can give you the price if you did not use any insurance.  - You can print the associated coupon and take it with your prescription to the pharmacy.  - You may also stop by our office during regular business hours and pick up a GoodRx coupon card.  - If you need your prescription sent electronically to a different pharmacy, notify our office through [facility] or by phone at [phone] option  4.     Si Usted Necesita Algo Despus de Su Visita  Tambin puede enviarnos un mensaje a travs de Clinical cytogeneticist. Por lo general respondemos a los mensajes de MyChart en el transcurso de 1 a  2 das hbiles.  Para renovar recetas, por favor pida a su farmacia que se ponga en contacto con nuestra oficina. Randi lakes de fax es [place] [phone].  Si tiene un asunto urgente cuando la clnica est cerrada y que no puede esperar hasta el siguiente da hbil, puede llamar/localizar a su doctor(a) al nmero que aparece a continuacin.   Por favor, tenga en cuenta que aunque hacemos todo lo posible para estar disponibles para asuntos urgentes fuera del horario de [place], no estamos disponibles las 24 horas del da, los 7 [address]  [address] de la [place].   Si tiene un problema urgente y no puede comunicarse con nosotros, puede optar por buscar atencin mdica  en el consultorio de su doctor(a), en una clnica privada, en un centro de atencin urgente o en una sala de emergencias.  Si tiene Engineer, drilling, por favor llame inmediatamente al 911 o vaya a la sala de emergencias.  Nmeros de bper  - Dr. Hester: [phone]  - Dra. Jackquline: [phone]  - Dr. Claudene: [phone]  - Dra. Kitts: [phone]  En caso de inclemencias del [place], por favor llame a nuestra lnea principal al [phone] para una actualizacin sobre el estado de cualquier retraso o cierre.  Consejos para la medicacin en dermatologa: Por favor, guarde las cajas en las que vienen los medicamentos de uso tpico para ayudarle a seguir las instrucciones sobre dnde y cmo usarlos. Las farmacias generalmente imprimen las instrucciones del medicamento slo en las cajas y no directamente en los tubos del [place].   Si su medicamento es muy caro, por favor, pngase en contacto con landry rieger llamando al [phone] y presione la opcin 4 o envenos un mensaje a travs de Clinical cytogeneticist.   No podemos decirle cul ser su copago  por los medicamentos por adelantado ya que esto es diferente dependiendo de la cobertura de su seguro. Sin embargo, es posible que podamos encontrar un medicamento sustituto a Audiological scientist un formulario para que el seguro cubra el medicamento que se considera necesario.   Si se requiere una autorizacin previa para que su compaa de seguros malta su medicamento, por favor permtanos de 1 a 2 das hbiles para completar este proceso.  Los precios de los medicamentos varan con frecuencia dependiendo del Environmental consultant de dnde se surte la receta y alguna farmacias pueden ofrecer precios ms baratos.  El sitio web www.goodrx.com tiene cupones para medicamentos de Health and safety inspector. Los precios aqu no tienen en cuenta lo que podra costar con la ayuda del seguro (puede ser ms barato con su seguro), pero el sitio web puede darle el precio si no utiliz Tourist information centre manager.  - Puede imprimir el cupn correspondiente y llevarlo con su receta a la farmacia.  - Tambin puede pasar por nuestra oficina durante el horario de atencin regular y Education officer, museum una tarjeta de cupones de GoodRx.  - Si necesita que su receta se enve electrnicamente a una farmacia diferente, informe a nuestra oficina a travs de MyChart de [place] o por telfono llamando al [phone] y presione la opcin 4.

## 2024-05-31 ENCOUNTER — Encounter: Payer: Self-pay | Admitting: Dermatology

## 2024-05-31 ENCOUNTER — Ambulatory Visit
Admission: RE | Admit: 2024-05-31 | Discharge: 2024-05-31 | Disposition: A | Discharge status: Home / Self Care | Source: Ambulatory Visit | Attending: Obstetrics and Gynecology | Admitting: Obstetrics and Gynecology

## 2024-05-31 ENCOUNTER — Ambulatory Visit: Payer: Self-pay | Admitting: Dermatology

## 2024-05-31 DIAGNOSIS — Z1231 Encounter for screening mammogram for malignant neoplasm of breast: Secondary | ICD-10-CM | POA: Insufficient documentation

## 2024-05-31 LAB — SURGICAL PATHOLOGY

## 2024-06-04 ENCOUNTER — Encounter: Payer: Self-pay | Admitting: Dermatology

## 2024-06-04 NOTE — Telephone Encounter (Signed)
 Discussed biopsy results with patient

## 2024-06-04 NOTE — Telephone Encounter (Signed)
-----   Message from Alm Rhyme sent at 05/31/2024  5:34 PM EDT ----- FINAL DIAGNOSIS        1. Skin, right medial aspect of 5th distal dorsal toe :       DYSPLASTIC COMPOUND NEVUS WITH MODERATE ATYPIA, CLOSE TO MARGIN        2. Skin, right anterior shoulder lateral chest :       DYSPLASTIC JUNCTIONAL NEVUS WITH MODERATE ATYPIA, CLOSE TO MARGIN   1&2 - Both Moderate Dysplastic Recheck next visit ----- Message ----- From: Interface, Lab In Three Zero One Sent: 05/31/2024   5:09 PM EDT To: Alm JAYSON Rhyme, MD

## 2024-06-04 NOTE — Telephone Encounter (Addendum)
 Tried calling patient regarding bx results. No answer. Lm for patient to return call.   ----- Message from Alm Rhyme sent at 05/31/2024  5:34 PM EDT ----- FINAL DIAGNOSIS        1. Skin, right medial aspect of 5th distal dorsal toe :       DYSPLASTIC COMPOUND NEVUS WITH MODERATE ATYPIA, CLOSE TO MARGIN        2. Skin, right anterior shoulder lateral chest :       DYSPLASTIC JUNCTIONAL NEVUS WITH MODERATE ATYPIA, CLOSE TO MARGIN   1&2 - Both Moderate Dysplastic Recheck next visit ----- Message ----- From: Interface, Lab In Three Zero One Sent: 05/31/2024   5:09 PM EDT To: Alm JAYSON Rhyme, MD

## 2024-06-05 ENCOUNTER — Ambulatory Visit: Payer: Self-pay | Admitting: Obstetrics and Gynecology

## 2024-06-05 ENCOUNTER — Other Ambulatory Visit: Payer: Self-pay | Admitting: Obstetrics and Gynecology

## 2024-06-05 DIAGNOSIS — R928 Other abnormal and inconclusive findings on diagnostic imaging of breast: Secondary | ICD-10-CM

## 2024-06-06 ENCOUNTER — Ambulatory Visit
Admission: RE | Admit: 2024-06-06 | Discharge: 2024-06-06 | Disposition: A | Discharge status: Home / Self Care | Source: Ambulatory Visit | Attending: Obstetrics and Gynecology | Admitting: Obstetrics and Gynecology

## 2024-06-06 ENCOUNTER — Ambulatory Visit: Payer: BC Managed Care – PPO | Admitting: Dermatology

## 2024-06-06 DIAGNOSIS — R928 Other abnormal and inconclusive findings on diagnostic imaging of breast: Secondary | ICD-10-CM | POA: Insufficient documentation

## 2024-06-07 ENCOUNTER — Ambulatory Visit: Payer: Self-pay | Admitting: Obstetrics and Gynecology

## 2024-10-02 ENCOUNTER — Ambulatory Visit: Admitting: Dermatology

## 2024-10-02 ENCOUNTER — Encounter: Payer: Self-pay | Admitting: Dermatology

## 2024-10-02 DIAGNOSIS — W908XXA Exposure to other nonionizing radiation, initial encounter: Secondary | ICD-10-CM

## 2024-10-02 DIAGNOSIS — Z85820 Personal history of malignant melanoma of skin: Secondary | ICD-10-CM

## 2024-10-02 DIAGNOSIS — D2271 Melanocytic nevi of right lower limb, including hip: Secondary | ICD-10-CM | POA: Diagnosis not present

## 2024-10-02 DIAGNOSIS — D489 Neoplasm of uncertain behavior, unspecified: Secondary | ICD-10-CM

## 2024-10-02 DIAGNOSIS — Z86018 Personal history of other benign neoplasm: Secondary | ICD-10-CM

## 2024-10-02 DIAGNOSIS — L821 Other seborrheic keratosis: Secondary | ICD-10-CM | POA: Diagnosis not present

## 2024-10-02 DIAGNOSIS — D485 Neoplasm of uncertain behavior of skin: Secondary | ICD-10-CM | POA: Diagnosis not present

## 2024-10-02 DIAGNOSIS — L814 Other melanin hyperpigmentation: Secondary | ICD-10-CM | POA: Diagnosis not present

## 2024-10-02 DIAGNOSIS — L578 Other skin changes due to chronic exposure to nonionizing radiation: Secondary | ICD-10-CM

## 2024-10-02 DIAGNOSIS — Z86006 Personal history of melanoma in-situ: Secondary | ICD-10-CM | POA: Diagnosis not present

## 2024-10-02 DIAGNOSIS — Z1283 Encounter for screening for malignant neoplasm of skin: Secondary | ICD-10-CM | POA: Diagnosis not present

## 2024-10-02 DIAGNOSIS — D229 Melanocytic nevi, unspecified: Secondary | ICD-10-CM

## 2024-10-02 NOTE — Patient Instructions (Addendum)
 Biopsy Wound Care Instructions  Leave the original bandage on for 24 hours if possible.  If the bandage becomes soaked or soiled before that time, it is OK to remove it and examine the wound.  A small amount of post-operative bleeding is normal.  If excessive bleeding occurs, remove the bandage, place gauze over the site and apply continuous pressure (no peeking) over the area for 30 minutes. If this does not work, please call our clinic as soon as possible or page your doctor if it is after hours.   Once a day, cleanse the wound with soap and water. It is fine to shower. If a thick crust develops you may use a Q-tip dipped into dilute hydrogen peroxide (mix 1:1 with water) to dissolve it.  Hydrogen peroxide can slow the healing process, so use it only as needed.    After washing, apply petroleum jelly (Vaseline) or an antibiotic ointment if your doctor prescribed one for you, followed by a bandage.    For best healing, the wound should be covered with a layer of ointment at all times. If you are not able to keep the area covered with a bandage to hold the ointment in place, this may mean re-applying the ointment several times a day.  Continue this wound care until the wound has healed and is no longer open.   Itching and mild discomfort is normal during the healing process. However, if you develop pain or severe itching, please call our office.   If you have any discomfort, you can take Tylenol (acetaminophen) or ibuprofen as directed on the bottle. (Please do not take these if you have an allergy to them or cannot take them for another reason).  Some redness, tenderness and white or yellow material in the wound is normal healing.  If the area becomes very sore and red, or develops a thick yellow-green material (pus), it may be infected; please notify us .    If you have stitches, return to clinic as directed to have the stitches removed. You will continue wound care for 2-3 days after the stitches  are removed.   Wound healing continues for up to one year following surgery. It is not unusual to experience pain in the scar from time to time during the interval.  If the pain becomes severe or the scar thickens, you should notify the office.    A slight amount of redness in a scar is expected for the first six months.  After six months, the redness will fade and the scar will soften and fade.  The color difference becomes less noticeable with time.  If there are any problems, return for a post-op surgery check at your earliest convenience.  To improve the appearance of the scar, you can use silicone scar gel, cream, or sheets (such as Mederma or Serica) every night for up to one year. These are available over the counter (without a prescription).  Please call our office at [phone] for any questions or concerns.       Melanoma ABCDEs  Melanoma is the most dangerous type of skin cancer, and is the leading cause of death from skin disease.  You are more likely to develop melanoma if you: Have light-colored skin, light-colored eyes, or red or blond hair Spend a lot of time in the sun Tan regularly, either outdoors or in a tanning bed Have had blistering sunburns, especially during childhood Have a close family member who has had a melanoma Have atypical moles  or large birthmarks  Early detection of melanoma is key since treatment is typically straightforward and cure rates are extremely high if we catch it early.   The first sign of melanoma is often a change in a mole or a new dark spot.  The ABCDE system is a way of remembering the signs of melanoma.  A for asymmetry:  The two halves do not match. B for border:  The edges of the growth are irregular. C for color:  A mixture of colors are present instead of an even brown color. D for diameter:  Melanomas are usually (but not always) greater than 6mm - the size of a pencil eraser. E for evolution:  The spot keeps changing in  size, shape, and color.  Please check your skin once per month between visits. You can use a small mirror in front and a large mirror behind you to keep an eye on the back side or your body.   If you see any new or changing lesions before your next follow-up, please call to schedule a visit.  Please continue daily skin protection including broad spectrum sunscreen SPF 30+ to sun-exposed areas, reapplying every 2 hours as needed when you're outdoors.   Staying in the shade or wearing long sleeves, sun glasses (UVA+UVB protection) and wide brim hats (4-inch brim around the entire circumference of the hat) are also recommended for sun protection.     Due to recent changes in healthcare laws, you may see results of your pathology and/or laboratory studies on MyChart before the doctors have had a chance to review them. We understand that in some cases there may be results that are confusing or concerning to you. Please understand that not all results are received at the same time and often the doctors may need to interpret multiple results in order to provide you with the best plan of care or course of treatment. Therefore, we ask that you please give us  2 business days to thoroughly review all your results before contacting the office for clarification. Should we see a critical lab result, you will be contacted sooner.   If You Need Anything After Your Visit  If you have any questions or concerns for your doctor, please call our main line at [phone] and press option 4 to reach your doctor's medical assistant. If no one answers, please leave a voicemail as directed and we will return your call as soon as possible. Messages left after 4 pm will be answered the following business day.   You may also send us  a message via MyChart. We typically respond to MyChart messages within 1-2 business days.  For prescription refills, please ask your pharmacy to contact our office. Our fax number is  [phone].  If you have an urgent issue when the clinic is closed that cannot wait until the next business day, you can page your doctor at the number below.    Please note that while we do our best to be available for urgent issues outside of office hours, we are not available 24/7.   If you have an urgent issue and are unable to reach us , you may choose to seek medical care at your doctor's office, retail clinic, urgent care center, or emergency room.  If you have a medical emergency, please immediately call 911 or go to the emergency department.  Pager Numbers  - Dr. Hester: [phone]  - Dr. Jackquline: [phone]  - Dr. Claudene: [phone]   - Dr. Raymund:  [phone]  In the event of inclement weather, please call our main line at [phone] for an update on the status of any delays or closures.  Dermatology Medication Tips: Please keep the boxes that topical medications come in in order to help keep track of the instructions about where and how to use these. Pharmacies typically print the medication instructions only on the boxes and not directly on the medication tubes.   If your medication is too expensive, please contact our office at [phone] option 4 or send us  a message through MyChart.   We are unable to tell what your co-pay for medications will be in advance as this is different depending on your insurance coverage. However, we may be able to find a substitute medication at lower cost or fill out paperwork to get insurance to cover a needed medication.   If a prior authorization is required to get your medication covered by your insurance company, please allow us  1-2 business days to complete this process.  Drug prices often vary depending on where the prescription is filled and some pharmacies may offer cheaper prices.  The website www.goodrx.com contains coupons for medications through different pharmacies. The prices here do not account for what the cost  may be with help from insurance (it may be cheaper with your insurance), but the website can give you the price if you did not use any insurance.  - You can print the associated coupon and take it with your prescription to the pharmacy.  - You may also stop by our office during regular business hours and pick up a GoodRx coupon card.  - If you need your prescription sent electronically to a different pharmacy, notify our office through [facility] or by phone at [phone] option 4.     Si Usted Necesita Algo Despus de Su Visita  Tambin puede enviarnos un mensaje a travs de Clinical cytogeneticist. Por lo general respondemos a los mensajes de MyChart en el transcurso de 1 a 2 das hbiles.  Para renovar recetas, por favor pida a su farmacia que se ponga en contacto con nuestra oficina. Randi lakes de fax es [place] [phone].  Si tiene un asunto urgente cuando la clnica est cerrada y que no puede esperar hasta el siguiente da hbil, puede llamar/localizar a su doctor(a) al nmero que aparece a continuacin.   Por favor, tenga en cuenta que aunque hacemos todo lo posible para estar disponibles para asuntos urgentes fuera del horario de [place], no estamos disponibles las 24 horas del da, los 7 [address]  [address] de la [place].   Si tiene un problema urgente y no puede comunicarse con nosotros, puede optar por buscar atencin mdica  en el consultorio de su doctor(a), en una clnica privada, en un centro de atencin urgente o en una sala de emergencias.  Si tiene Engineer, drilling, por favor llame inmediatamente al 911 o vaya a la sala de emergencias.  Nmeros de bper  - Dr. Hester: [phone]  - Dra. Jackquline: [phone]  - Dr. Claudene: [phone]  - Dra. Kitts: [phone]  En caso de inclemencias del [place], por favor llame a nuestra lnea principal al [phone] para una actualizacin sobre el estado de cualquier retraso o cierre.  Consejos para la medicacin en dermatologa: Por  favor, guarde las cajas en las que vienen los medicamentos de uso tpico para ayudarle a seguir las instrucciones sobre dnde y cmo usarlos. Las farmacias generalmente imprimen las instrucciones del medicamento slo en las cajas y no directamente  en los tubos del medicamento.   Si su medicamento es muy caro, por favor, pngase en contacto con landry rieger llamando al [phone] y presione la opcin 4 o envenos un mensaje a travs de Clinical cytogeneticist.   No podemos decirle cul ser su copago por los medicamentos por adelantado ya que esto es diferente dependiendo de la cobertura de su seguro. Sin embargo, es posible que podamos encontrar un medicamento sustituto a Audiological scientist un formulario para que el seguro cubra el medicamento que se considera necesario.   Si se requiere una autorizacin previa para que su compaa de seguros malta su medicamento, por favor permtanos de 1 a 2 das hbiles para completar este proceso.  Los precios de los medicamentos varan con frecuencia dependiendo del Environmental consultant de dnde se surte la receta y alguna farmacias pueden ofrecer precios ms baratos.  El sitio web www.goodrx.com tiene cupones para medicamentos de Health and safety inspector. Los precios aqu no tienen en cuenta lo que podra costar con la ayuda del seguro (puede ser ms barato con su seguro), pero el sitio web puede darle el precio si no utiliz Tourist information centre manager.  - Puede imprimir el cupn correspondiente y llevarlo con su receta a la farmacia.  - Tambin puede pasar por nuestra oficina durante el horario de atencin regular y Education officer, museum una tarjeta de cupones de GoodRx.  - Si necesita que su receta se enve electrnicamente a una farmacia diferente, informe a nuestra oficina a travs de MyChart de [place] o por telfono llamando al [phone] y presione la opcin 4.

## 2024-10-04 LAB — SURGICAL PATHOLOGY

## 2025-01-30 ENCOUNTER — Ambulatory Visit: Admitting: Dermatology
